# Patient Record
Sex: Female | Born: 1965 | Race: White | Hispanic: No | Marital: Married | Smoking: Former smoker
Health system: Southern US, Community
[De-identification: ages and names within clinical notes are randomized; demographics above are authoritative.]

## PROBLEM LIST (undated history)

## (undated) DIAGNOSIS — F419 Anxiety disorder, unspecified: Secondary | ICD-10-CM

## (undated) DIAGNOSIS — M26609 Unspecified temporomandibular joint disorder, unspecified side: Secondary | ICD-10-CM

## (undated) DIAGNOSIS — T7840XA Allergy, unspecified, initial encounter: Secondary | ICD-10-CM

## (undated) DIAGNOSIS — I10 Essential (primary) hypertension: Secondary | ICD-10-CM

## (undated) DIAGNOSIS — R011 Cardiac murmur, unspecified: Secondary | ICD-10-CM

## (undated) DIAGNOSIS — N201 Calculus of ureter: Secondary | ICD-10-CM

## (undated) DIAGNOSIS — Z87442 Personal history of urinary calculi: Secondary | ICD-10-CM

## (undated) DIAGNOSIS — E785 Hyperlipidemia, unspecified: Secondary | ICD-10-CM

## (undated) DIAGNOSIS — Z8619 Personal history of other infectious and parasitic diseases: Secondary | ICD-10-CM

## (undated) HISTORY — PX: OTHER SURGICAL HISTORY: SHX169

## (undated) HISTORY — DX: Allergy, unspecified, initial encounter: T78.40XA

## (undated) HISTORY — PX: POLYPECTOMY: SHX149

## (undated) HISTORY — DX: Anxiety disorder, unspecified: F41.9

## (undated) HISTORY — DX: Essential (primary) hypertension: I10

## (undated) HISTORY — DX: Cardiac murmur, unspecified: R01.1

---
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
## 1998-01-20 ENCOUNTER — Inpatient Hospital Stay (HOSPITAL_COMMUNITY): Admission: AD | Admit: 1998-01-20 | Discharge: 1998-01-25 | Payer: Self-pay | Admitting: Obstetrics and Gynecology

## 1998-02-06 ENCOUNTER — Encounter (HOSPITAL_COMMUNITY): Admission: RE | Admit: 1998-02-06 | Discharge: 1998-05-07 | Payer: Self-pay | Admitting: Obstetrics and Gynecology

## 1999-04-12 ENCOUNTER — Other Ambulatory Visit: Admission: RE | Admit: 1999-04-12 | Discharge: 1999-04-12 | Payer: Self-pay | Admitting: Obstetrics and Gynecology

## 2000-04-27 ENCOUNTER — Encounter (INDEPENDENT_AMBULATORY_CARE_PROVIDER_SITE_OTHER): Payer: Self-pay | Admitting: Specialist

## 2000-04-27 ENCOUNTER — Encounter: Payer: Self-pay | Admitting: Emergency Medicine

## 2000-04-27 ENCOUNTER — Observation Stay (HOSPITAL_COMMUNITY): Admission: AD | Admit: 2000-04-27 | Discharge: 2000-04-28 | Payer: Self-pay | Admitting: Obstetrics & Gynecology

## 2000-04-27 HISTORY — PX: OTHER SURGICAL HISTORY: SHX169

## 2000-06-02 ENCOUNTER — Other Ambulatory Visit: Admission: RE | Admit: 2000-06-02 | Discharge: 2000-06-02 | Payer: Self-pay | Admitting: Obstetrics and Gynecology

## 2001-02-17 ENCOUNTER — Other Ambulatory Visit: Admission: RE | Admit: 2001-02-17 | Discharge: 2001-02-17 | Payer: Self-pay | Admitting: Obstetrics and Gynecology

## 2001-09-09 ENCOUNTER — Inpatient Hospital Stay (HOSPITAL_COMMUNITY): Admission: AD | Admit: 2001-09-09 | Discharge: 2001-09-13 | Payer: Self-pay | Admitting: Obstetrics and Gynecology

## 2001-10-08 ENCOUNTER — Other Ambulatory Visit: Admission: RE | Admit: 2001-10-08 | Discharge: 2001-10-08 | Payer: Self-pay | Admitting: Obstetrics and Gynecology

## 2002-10-25 ENCOUNTER — Encounter: Admission: RE | Admit: 2002-10-25 | Discharge: 2002-10-25 | Payer: Self-pay | Admitting: Family Medicine

## 2002-10-25 ENCOUNTER — Encounter: Payer: Self-pay | Admitting: Family Medicine

## 2002-11-11 ENCOUNTER — Other Ambulatory Visit: Admission: RE | Admit: 2002-11-11 | Discharge: 2002-11-11 | Payer: Self-pay | Admitting: Obstetrics and Gynecology

## 2004-02-14 ENCOUNTER — Other Ambulatory Visit: Admission: RE | Admit: 2004-02-14 | Discharge: 2004-02-14 | Payer: Self-pay | Admitting: Obstetrics and Gynecology

## 2004-09-13 ENCOUNTER — Emergency Department (HOSPITAL_COMMUNITY): Admission: EM | Admit: 2004-09-13 | Discharge: 2004-09-14 | Payer: Self-pay | Admitting: Emergency Medicine

## 2005-04-10 ENCOUNTER — Other Ambulatory Visit: Admission: RE | Admit: 2005-04-10 | Discharge: 2005-04-10 | Payer: Self-pay | Admitting: Obstetrics and Gynecology

## 2005-06-25 ENCOUNTER — Encounter: Admission: RE | Admit: 2005-06-25 | Discharge: 2005-06-25 | Payer: Self-pay | Admitting: Obstetrics and Gynecology

## 2006-04-30 ENCOUNTER — Emergency Department (HOSPITAL_COMMUNITY): Admission: EM | Admit: 2006-04-30 | Discharge: 2006-04-30 | Payer: Self-pay | Admitting: Pediatrics

## 2006-05-02 ENCOUNTER — Ambulatory Visit (HOSPITAL_BASED_OUTPATIENT_CLINIC_OR_DEPARTMENT_OTHER): Admission: RE | Admit: 2006-05-02 | Discharge: 2006-05-02 | Payer: Self-pay | Admitting: Urology

## 2007-09-21 ENCOUNTER — Ambulatory Visit (HOSPITAL_COMMUNITY): Admission: RE | Admit: 2007-09-21 | Discharge: 2007-09-21 | Payer: Self-pay | Admitting: Urology

## 2009-03-14 ENCOUNTER — Encounter: Admission: RE | Admit: 2009-03-14 | Discharge: 2009-03-14 | Payer: Self-pay | Admitting: Family Medicine

## 2009-04-08 ENCOUNTER — Emergency Department (HOSPITAL_COMMUNITY): Admission: EM | Admit: 2009-04-08 | Discharge: 2009-04-08 | Payer: Self-pay | Admitting: Emergency Medicine

## 2010-06-17 ENCOUNTER — Encounter: Payer: Self-pay | Admitting: Obstetrics and Gynecology

## 2010-08-29 LAB — URINE MICROSCOPIC-ADD ON

## 2010-08-29 LAB — POCT I-STAT, CHEM 8
Chloride: 106 mEq/L (ref 96–112)
Creatinine, Ser: 0.6 mg/dL (ref 0.4–1.2)
HCT: 38 % (ref 36.0–46.0)
Hemoglobin: 12.9 g/dL (ref 12.0–15.0)
Potassium: 3.3 mEq/L — ABNORMAL LOW (ref 3.5–5.1)
TCO2: 21 mmol/L (ref 0–100)

## 2010-08-29 LAB — URINALYSIS, ROUTINE W REFLEX MICROSCOPIC
Bilirubin Urine: NEGATIVE
Ketones, ur: NEGATIVE mg/dL
Nitrite: NEGATIVE
Protein, ur: NEGATIVE mg/dL
Specific Gravity, Urine: 1.021 (ref 1.005–1.030)
Urobilinogen, UA: 0.2 mg/dL (ref 0.0–1.0)

## 2010-10-09 NOTE — Op Note (Signed)
NAMESHEBA, Tanya Lawson             ACCOUNT NO.:  [id number]   MEDICAL RECORD NO.:  [id number]          PATIENT TYPE:  AMB   LOCATION:  DAY                          FACILITY:  [facility]   PHYSICIAN:  Lindaann Slough, M.D.  DATE OF BIRTH:  [birth year]   DATE OF PROCEDURE:  09/21/2007  DATE OF DISCHARGE:                               OPERATIVE REPORT   PREOPERATIVE DIAGNOSIS:  Right ureteral stone with hydronephrosis.   POSTOPERATIVE DIAGNOSIS:  Right ureteral stone with hydronephrosis.   PROCEDURE DONE:  Cystoscopy, right retrograde pyelogram, ureteroscopy,  stone extraction and insertion of double-J catheter.   SURGEON:  Dr. Wendie Simmer Nesi.   ANESTHESIA:  General.   INDICATIONS:  The patient is a 45 year old female who has a long history  of kidney stone.  She was seen in the office this morning with a history  of sudden onset of severe right flank pain associated with nausea and  vomiting.  A CT scan showed a 4-mm stone in the right distal ureter with  hydronephrosis.  She also has bilateral renal calculi.  She was  scheduled for ESL, however, the stone could not be seen and, therefore,  she is now scheduled for cystoscopy, right retrograde pyelogram,  ureteroscopy and stone extraction.   DESCRIPTION OF PROCEDURE:  Under general anesthesia, the patient was  prepped and draped and placed in the dorsolithotomy position.  A well  lubricated panendoscope was inserted in the bladder.  The bladder mucosa  was normal.  There was no stone or tumor in the bladder.  The ureteral  orifices were in normal position and shape.   Retrograde pyelogram:   A cone-tipped catheter was passed through the cystoscope and into the  right ureteral orifice.  There was a filling defect in the distal ureter  with a proximal hydroureter.  The dye could not fill the proximal ureter  nor the renal pelvis.  The cone-tipped catheter was then removed.   A Glidewire was passed through an open-ended catheter and the  open-ended  catheter and Glidewire were passed through the cystoscope into the right  ureteral orifice, and the guidewire was advanced up into the renal  pelvis.  The open-ended catheter was then advanced over the Glidewire  into the distal ureter and the Glidewire was removed.  It was replaced  with a sensor tip guidewire.  The open-ended catheter was then removed,  the bladder was then emptied and the cystoscope removed.  A semi-rigid  ureteroscope was then passed in the bladder and without difficulty in  the ureter.  The stone was then visualized in the distal ureter.  The  nitinol basket was then passed through the cystoscope and the stone was  grasped within the wires of the basket and extracted without difficulty.  The stone was then retrieved.  The ureteroscope was then reinserted in  the ureter and contrast was injected through the ureteroscope.  There  was no evidence of extravasation of contrast.  The ureteroscope was then  removed.  The sensor guide wire was then back-loaded into the cystoscope  and a #6-French - 26 double-J catheter was  passed over the guidewire.  The proximal curl of the double-J catheter is in the renal pelvis, the  distal curl is in the bladder.  The bladder was then emptied and the  cystoscope and guidewire removed.   The patient tolerated the procedure well and left the OR in satisfactory  condition to the post anesthesia care unit.      Lindaann Slough, M.D.  Electronically Signed     MN/MEDQ  D:  09/21/2007  T:  09/21/2007  Job:  [id number]

## 2010-10-12 NOTE — H&P (Signed)
[facility]  Patient:    Tanya Lawson, Tanya Lawson Visit Number: [id number] MRN: [id number]          Service Type: OBS Location: 910A 9140 01 Attending Physician:  Frederich Balding Dictated by:   Juluis Mire, M.D. Admit Date:  09/09/2001                           History and Physical  HISTORY OF PRESENT ILLNESS:   The patient is a 45 year old gravida 5, para 1, abortus 3, married white female who presents for repeat cesarean section. Last menstrual period was July 13th, giving her an estimated date of confinement of April 20th of this year; this gives her an estimated gestational age of 39+ weeks.  This is consistent with initial exam and early ultrasound.  In relation to the present admission, the patient had a previous cesarean section for failure to progress in August of 1999; that infant did weigh 9 pounds 14 ounces.  Estimated fetal weight of this baby is 8 to 9 pounds. She has had basically unfavorable cervical change and now presents for a repeat cesarean section.  The options of trial of labor have been discussed and declined by the patient.  Her prenatal course was complicated by advanced maternal age.  She did decline any type of genetic evaluation.  She states she would not terminate the pregnancy.  Ultrasound evaluations have basically been unremarkable.  ALLERGIES:                    She is allergic to PENICILLIN and SULFA.  MEDICATIONS:                  Prenatal vitamins.  PRENATAL LABORATORY DATA:     She is O-positive with a negative antibody screen.  She has a nonreactive serology, positive rubella titer, negative hepatitis B surface antigen.  Her maternal serum screening at 16 weeks was negative.  Her 50 g glucola was within normal limits.  PAST MEDICAL HISTORY:         Please see prenatal records.  FAMILY HISTORY:               Please see prenatal records.  SOCIAL HISTORY:               Please see prenatal  records.  REVIEW OF SYSTEMS:            Noncontributory.  PHYSICAL EXAMINATION:  VITAL SIGNS:                  The patient is afebrile with stable vital signs.  HEENT:                        The patient is normocephalic.  Pupils equal, round and reactive to light and accommodation.  Extraocular movements are intact.  Sclerae and conjunctivae clear.  Oropharynx clear.  NECK:                         Without thyromegaly.  BREASTS:                      No discrete masses.  LUNGS:                        Clear.  CARDIAC:  Regular rhythm and rate with a grade 2/6 systolic ejection murmur.  No clicks or gallops.  ABDOMEN:                      Gravid uterus consistent with dates.  There is no mass, organomegaly or tenderness.  Fetal heart tones are noted.  PELVIC:                       Cervix is long and closed.  EXTREMITIES:                  Trace edema.  NEUROLOGIC:                   Grossly within normal limits.  BASIC IMPRESSION:             Intrauterine pregnancy at term with prior cesarean section and desirous of repeat.  PLAN:                         The patient will undergo repeat cesarean section.  The risks of surgery have been discussed including the risk of infection, the risk of hemorrhage that could necessitate transfusion with the risks of AIDS or hepatitis, risk of injury to adjacent organs including bladder, bowel or ureters that could require further exploratory surgery, the risks of deep venous thrombosis and pulmonary embolus.  The patient expressed understanding of indications and risks. Dictated by:   Juluis Mire, M.D. Attending Physician:  Frederich Balding DD:  09/09/01 TD:  09/09/01 Job: [id number]

## 2010-10-12 NOTE — Op Note (Signed)
[facility]  Patient:    Tanya Lawson, Tanya Lawson                    MRN: [id number] Proc. Date: 04/27/00 Adm. Date:  [id number] Attending:  Minette Headland                           Operative Report  PREOPERATIVE DIAGNOSIS:       Suspected ectopic pregnancy.  POSTOPERATIVE DIAGNOSIS:      Left tubal pregnancy.  OPERATION:                    Laparoscopy, left salpingectomy with resection of tube and ectopic pregnancy.  SURGEON:                      Freddy Finner, M.D.  ANESTHESIA:                   General endotracheal.  INTRAOPERATIVE COMPLICATIONS: None  HISTORY:                      The patient is a 45 year old gravida 4, para 1, who had one spontaneous ab, and just had one previous ectopic pregnancy treated with methotrexate.  She was then followed in the office for approximately two weeks with findings thought initially to be consistent with early spontaneous ab.  On the evening prior to the surgery, she had intensified abdominal pain and went to the [facility] where she was evaluated and had a quantitative HCG of 15,000, and empty uterus, and complex adnexal mass.  She was transferred to [facility] and is now brought to the operating room for surgery.  INTRAOPERATIVE FINDINGS:      Dilated, bleeding, left fallopian tube with dilatation along 2/3 of the length of the tube.  The right fallopian tube and ovary were completely normal other than adhesions binding the fimbria to the left ovary.  The left ovary was normal.  The uterus itself was boggy and consistent with pregnancy.  There was a small amount of free blood in the abdomen on entry.  DESCRIPTION OF PROCEDURE:     Following admission, the patient was taken to the operating room and there placed on adequate general endotracheal anesthesia, placed in the dorsal lithotomy position using the Allen stirrup system.  Betadine prep of abdomen, perineum, and vagina was carried  out in the usual fashion.  Bladder was catheterized using sterile technique and a Foley catheter.  Hulka tenaculum was attached to the cervix; sterile drapes were applied to the abdomen.  Two small incisions were made, one at the umbilicus and one just above the symphysis.  Through the upper incision, a 10 mm trocar was introduced while elevating the anterior abdominal wall manually.  Direct inspection revealed adequate placement with no evidence of injury on entry, and there was peritoneal staining with blood on entry.  Pneumoperitoneum was allowed to accumulate with carbon dioxide gas.  A second 5 mm trocar was placed through an incision made just above the symphysis.  A blunt probe Nezhat irrigating system and grasping forceps were used through this trocar site.  Careful inspection of the pelvic contents was carried out, including inspection of both adnexa.  There was marked dilatation of the left fallopian tube which was dark and filled with blood clot and blood oozing from the distal fimbria of the left tube.  It was elected to perform a salpingectomy. The mesosalpinx was progressively developed and cauterized with bipolar forceps and then sharply divided to completely remove the left fallopian tube.  It was then removed through the umbilical port using a grasping forceps through the eye of the operating channel of the laparoscope.  Copious irrigation was carried out.  Repeat bipolar fulguration was carried out in the left adnexum to confirm complete hemostasis.  All irrigating solution was aspirated from the abdomen.  The procedure was terminated at this point.  Gas was allowed to escape from the abdomen.  The skin incisions were closed with interrupted subcuticular sutures of 3-0 Dexon.  Steri-Strips were applied to both incisions.  Then, 10 cc of 0.25% plain Marcaine was then used to inject into the incision sites for postoperative analgesia.  The patient tolerated the operative  procedure well and was taken to the recovery room in good condition to be admitted overnight as an observation status patient. DD:  04/27/00 TD:  04/27/00 Job: [id number]

## 2010-10-12 NOTE — Discharge Summary (Signed)
[facility]  Patient:    Tanya Lawson, Tanya Lawson Visit Number: [id number] MRN: [id number]          Service Type: OBS Location: 910A 9140 01 Attending Physician:  Frederich Balding Dictated by:   Julio Sicks, N.P. Admit Date:  09/09/2001 Discharge Date: 09/13/2001                             Discharge Summary  ADMITTING DIAGNOSIS:             Intrauterine pregnancy at term with prior                                  cesarean delivery and desirous of repeat.  DISCHARGE DIAGNOSIS:             Low transverse cesarean delivery of a viable                                  female infant.  PROCEDURE:                       Repeat low transverse cesarean section.  REASON FOR ADMISSION:            Please see dictated H&P.  HOSPITAL COURSE:                 The patient was taken to the operating room for the above-named procedure, a Pfannenstiel incision was made with a viable female infant delivered weighing 8 pounds 5 ounces with Apgars of 9 at one minute and 9 at five minutes.  Umbilical artery pH was 7.33.  On postoperative day #1, the patient had good return of bowel function.  Fundus was firm, lochia small.  Abdominal dressing was clean, dry, and intact.  Foley was removed and the patient was voiding without difficulty.  On postoperative day #2, abdomen remained soft, incision was clean, dry, and intact.  Labs revealed a hemoglobin of 8.3, hematocrit of 23.7, and WBC count 10.8.  On postoperative day #3, the patient was doing well, ambulating without assistance.  On postoperative day #4, the patient was doing well, staples were removed and the patient was discharged home.  CONDITION ON DISCHARGE:          Good.  DIET:                            Regular as tolerated.  ACTIVITY:                        No heavy lifting, no driving x2 weeks, and no vaginal entry.  FOLLOWUP:                        The patient is to follow up in the office in 1-2 weeks  for an incision check.  She is to call for a temperature greater than 100 degrees, persistent nausea and vomiting, heavy vaginal bleeding and/or redness or drainage from the incision site.  DISCHARGE MEDICATIONS:           1. Tylox one to two every 3-4 hours p.r.n.  pain.                                  2. Motrin 600 mg over the counter every                                     6 hours.                                  3. Prenatal vitamins one p.o. daily. Dictated by:   Julio Sicks, N.P. Attending Physician:  Frederich Balding DD:  10/01/01 TD:  10/05/01 Job: [id number]

## 2010-10-12 NOTE — Op Note (Signed)
Tanya Lawson, Tanya Lawson             ACCOUNT NO.:  [id number]   MEDICAL RECORD NO.:  [id number]          PATIENT TYPE:  AMB   LOCATION:  NESC                         FACILITY:  [facility]   PHYSICIAN:  Boston Service, M.D.DATE OF BIRTH:  [birth year]   DATE OF PROCEDURE:  05/02/2006  DATE OF DISCHARGE:                               OPERATIVE REPORT   INTERNAL MEDICINE:  Talmadge Coventry, M.D.   GYNECOLOGY:  Juluis Mire, M.D.   UROLOGY:  Boston Service, M.D.   PREOP DIAGNOSIS:  A 6-mm right ureteral calculus.  Stone was in the  lower pole of the right kidney on CAT scan 09/13/2004.  The stone moved  to the right UPJ on  a KUB on 04/30/2006.  Retrograde films, however, on  05/02/2006 showed that the stone had moved to the distal ureter; it  measured approximately 6 mm.   POSTOP DIAGNOSIS:  A 6-mm right ureteral calculus.  Stone was in the  lower pole of the right kidney on CAT scan 09/13/2004.  The stone moved  to the right UPJ on  a KUB on 04/30/2006.  Retrograde films, however, on  05/02/2006 showed that the stone had moved to the distal ureter; it  measured approximately 6 mm.   PROCEDURES:  1. Cystoscopy.  2. Retrograde ureteroscopy.  3. Holmium laser fragmentation of distal ureteral calculus, double-J      stent placement.   ANESTHESIA:  General.   DRAINS:  A 6-French, 24 cm double-J stent, 18-French Foley.   COMPLICATIONS:  None obvious.   SURGEON:  Boston Service, M.D.   ASSISTANT:  None.   SPECIMENS:  Tiny stone fragments.   DESCRIPTION OF PROCEDURE:  The patient was prepped and draped in the  dorsal lithotomy position after institution of an adequate level of  general anesthesia.  A well-lubricated 21-French panendoscope was gently  inserted at the urethral meatus, normal urethra and sphincter, clear  efflux of left orifice, minimal efflux right orifice.  Blocking catheter  was selected; retrograde films were obtained.  Normal course and caliber  of  the ureter, pelvis, and calyces on the left side with prompt drainage  at 3-5 minutes.  Right retrogrades showed stone which 2 days prior had  been at the right UPJ was now at the right UVJ; and measured as about a  6-mm filling defect in the distal ureter.  With gentle injection of  contrast, I was able to negotiate the end-hole catheter beyond the  calculus.   There was an immediate hydronephrotic drip of purulent urine from the  right kidney.  This was allowed to subside over a period of about 50-20  minutes.  The ureteroscope was inserted alongside the guidewire; stone  was localized in the distal ureter; and felt to be too big to extract  with a basket.  A 365 holmium fiber was selected at a setting of 0.5.  Stone was then slowly fragmented over period of about 20 or 25 minutes.  A multitude of small 1-mm fragments were created.  No attempt was made  to remove any of the fragments from the distal ureter.  Ureteroscope  was  withdrawn.   A 6-French 24 cm double-J stent was then passed over the indwelling  guidewire with excellent pigtail formation on guidewire removal.  Foley  catheter was placed after cystoscope had been withdrawn.  The patient  was returned to recovery in satisfactory condition.           ______________________________  Boston Service, M.D.     RH/MEDQ  D:  05/02/2006  T:  05/02/2006  Job:  [id number]   cc:   Talmadge Coventry, M.D.  Fax: [phone]   Juluis Mire, M.D.  Fax: [phone]

## 2010-10-12 NOTE — Op Note (Signed)
[facility]  Patient:    Tanya Lawson, Tanya Lawson Visit Number: [id number] MRN: [id number]          Service Type: OBS Location: 910B 9198 02 Attending Physician:  Frederich Balding Dictated by:   Juluis Mire, M.D. Proc. Date: 09/09/01 Admit Date:  09/09/2001                             Operative Report  PREOPERATIVE DIAGNOSES:       1. Intrauterine pregnancy at term.                               2. Prior cesarean section, desires repeat.  POSTOPERATIVE DIAGNOSES:      1. Intrauterine pregnancy at term.                               2. Prior cesarean section, desires repeat.  OPERATIVE PROCEDURE:          Repeat low transverse cesarean section.  SURGEON:                      Juluis Mire, M.D.  ANESTHESIA:                   Spinal.  ESTIMATED BLOOD LOSS:         800 cc.  PACKS AND DRAINS:             None.  INTRAOPERATIVE BLOOD REPLACED:               None.  COMPLICATIONS:                None.  INDICATIONS:                  Noted in the history and physical.  DESCRIPTION OF PROCEDURE:     The patient was taken to the OR and placed in the supine position with a left lateral tilt.  After a satisfactory level of spinal anesthesia was obtained, the abdomen was prepped out with Betadine and draped as a sterile field.  A prior flow transverse skin incision was excised. The incision was then extended through the subcutaneous tissue.  The fascia was entered sharply and the incision in the fascia extended laterally.  The fascia was then taken off of the muscles superiorly and inferiorly.  The rectus muscles were separated in the midline.  The peritoneum was entered sharply and the incision in the peritoneum extended both superiorly and inferiorly.  A low transverse bladder flap was developed.  A low transverse uterine incision was begun with a knife and extended laterally using manual traction.  The infant was delivered from the Vertex  presentation.  It was delivered with elevation of the head and fundal pressure.  The infant was a viable female who weighed 8 pounds 5 ounces.  Apgars were 9/9.  Umbilical artery pH was 7.33.  There was a slight superficial scalpel nick on the babys right forehead.  It was extremely superficial.  The pediatricians were attending that.  The placenta was then delivered manually.  The uterus was wiped free of remaining membranes and placenta.  The uterus was closed with running locking suture of 0 Vicryl using a two-layer closure technique.  Areas of continued bleeding were brought under control with  figure-of-eights of 0 Vicryl.  At this point in time, we had good hemostasis.  The tubes and ovaries were unremarkable.  Urine output remained clear and adequate.  At this point in time, the muscles were approximated with running suture of 3-0 Vicryl.  The fascia was closed with a running suture of 0 PDS.  The skin was closed with staples and Steri-Strips.  Sponge, needle and instrument counts were reported as correct by the circulating nurse x2.  The Foley catheter remained clear at the time of closure.  The patient tolerated the procedure well and was returned to the recovery room in good condition. Dictated by:   Juluis Mire, M.D. Attending Physician:  Frederich Balding DD:  09/09/01 TD:  09/09/01 Job: [id number]

## 2011-02-19 LAB — HEMOGLOBIN AND HEMATOCRIT, BLOOD
HCT: 40.4
Hemoglobin: 13.1

## 2011-02-25 ENCOUNTER — Encounter (INDEPENDENT_AMBULATORY_CARE_PROVIDER_SITE_OTHER): Payer: Self-pay | Admitting: Surgery

## 2011-02-26 ENCOUNTER — Encounter (INDEPENDENT_AMBULATORY_CARE_PROVIDER_SITE_OTHER): Payer: Self-pay | Admitting: General Surgery

## 2011-02-27 ENCOUNTER — Encounter (INDEPENDENT_AMBULATORY_CARE_PROVIDER_SITE_OTHER): Payer: Self-pay | Admitting: General Surgery

## 2011-02-27 ENCOUNTER — Ambulatory Visit (INDEPENDENT_AMBULATORY_CARE_PROVIDER_SITE_OTHER): Payer: Commercial Indemnity | Admitting: General Surgery

## 2011-02-27 VITALS — BP 118/78 | HR 60 | Temp 97.5°F | Resp 20 | Ht 63.0 in | Wt 204.0 lb

## 2011-02-27 DIAGNOSIS — L089 Local infection of the skin and subcutaneous tissue, unspecified: Secondary | ICD-10-CM

## 2011-02-27 DIAGNOSIS — B029 Zoster without complications: Secondary | ICD-10-CM

## 2011-02-27 NOTE — Patient Instructions (Signed)
Keep the area clean and apply topical antibiotic cream, we will call with the path report when it returns

## 2011-02-27 NOTE — Progress Notes (Signed)
Subjective:     Patient ID: Tanya Lawson, female   DOB: Sep 29, 1965, 45 y.o.   MRN: [id number]  HPI The patient returns I saw her proximal 4-5 months ago when she says that she's had problems with a reoccurrence abscess "input that was originally informed of the perirectal infection with Dr. Luan Pulling many years ago I could find no evidence of any abscess on examination of the anus wondered if this could be a perirectal infection and did center before Y. 10 and white count was normal also got Dr. 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 and he could find no evidence of any frank abscess or problem within the anal canal either I suggested if she follow problems to return to see [place] in now she returns for months later she said that over the weekend she started having areas of tenderness and then her menstrual period started today and she got some low blisters in this general area that spontaneously drained yesterday appear on examination this is not a true perirectal bladder scan in the buttocks area left side done in the area where these problems have always, reoccurred looped up all of the gurgling and the only thing that we can see that was possibly related to the onset of her. This is certainly not endometriosis in this area as it could this be herpes zoster all of this type of problem and I got Dr. Dwain Sarna to reexamine her and he felt that although biopsy of the area of the scan would certainly be the next step actually did an ultrasound of fatty tissue and I found no evidence of any deep pocket like a true abscess but she's got cavity well in this area the scan that measures about 2 x 3 cm that is certainly swollen right laboratory process. The patient says that when she was 5 or 6 she had chickenpox that she's never had a thing that was actually diagnosed as showing the her pain is always in the same location this, a posterior buttocks right were used  to not truly infection around the anus and always comes of right before her period. This does not occur every menstrual cycle but she was just probably one out of three Review of Systems Past Medical History  Diagnosis Date  . Hypertension   . Heart murmur   . Rectal pain    Past Surgical History  Procedure Date  . Tonsillectomy 1985  . Cesarean section 1999, 2003  . Cervical dysplasia 1987  . Tubal ligation 2001    repair  . Ectopic rupture  December 5th, 2002  . Kidney stone removal  2008       Objective:   Physical ExamBP 118/78  Pulse 60  Temp 97.5 F (36.4 C)  Resp 20  Ht 5\' 3"  (1.6 m)  Wt 204 lb (92.534 kg)  BMI 36.14 kg/m2  Examination shows an area of erythema at 3 or 4 cm from the anal area on the left buttocks does wear pressure would bleed when she stated there is edema with not really a folliculitis within this little blisters that have spontaneously drained yesterday slightly more lateral I did a little triangle biopsied and the underlying tissue and dermis and I'll is definitely thickened and hopefully the pathologist will be able to also is consistent with shingles or herpes simplex. There is no frank abscess and we're going to just treat her with topical antibiotic cream.  Assessment:     See above note    Plan:    Followup 2 weeks

## 2011-03-08 ENCOUNTER — Telehealth (INDEPENDENT_AMBULATORY_CARE_PROVIDER_SITE_OTHER): Payer: Self-pay

## 2011-03-08 NOTE — Telephone Encounter (Signed)
Report faxed today- Patient will be called with results on Tuesday after Dr. Zachery Dakins reviews path.

## 2011-03-08 NOTE — Telephone Encounter (Signed)
Patient called requesting her biopsy results.  Please call

## 2011-03-12 ENCOUNTER — Ambulatory Visit (INDEPENDENT_AMBULATORY_CARE_PROVIDER_SITE_OTHER): Payer: Commercial Indemnity | Admitting: General Surgery

## 2011-03-14 ENCOUNTER — Encounter (INDEPENDENT_AMBULATORY_CARE_PROVIDER_SITE_OTHER): Payer: Self-pay | Admitting: General Surgery

## 2011-03-15 ENCOUNTER — Ambulatory Visit (INDEPENDENT_AMBULATORY_CARE_PROVIDER_SITE_OTHER): Payer: Commercial Indemnity | Admitting: General Surgery

## 2011-03-15 ENCOUNTER — Telehealth (INDEPENDENT_AMBULATORY_CARE_PROVIDER_SITE_OTHER): Payer: Self-pay

## 2011-03-15 NOTE — Telephone Encounter (Signed)
Patient given path report results. She will follow up during or if the problem should began to occur again here in the office. RMP

## 2011-03-31 IMAGING — CR DG CHEST 2V
2 series · 2 of 2 positions shown · non-contrast
Comparison: None

CLINICAL DATA: Cough.

CHEST - 2 VIEW

[w chest pa]
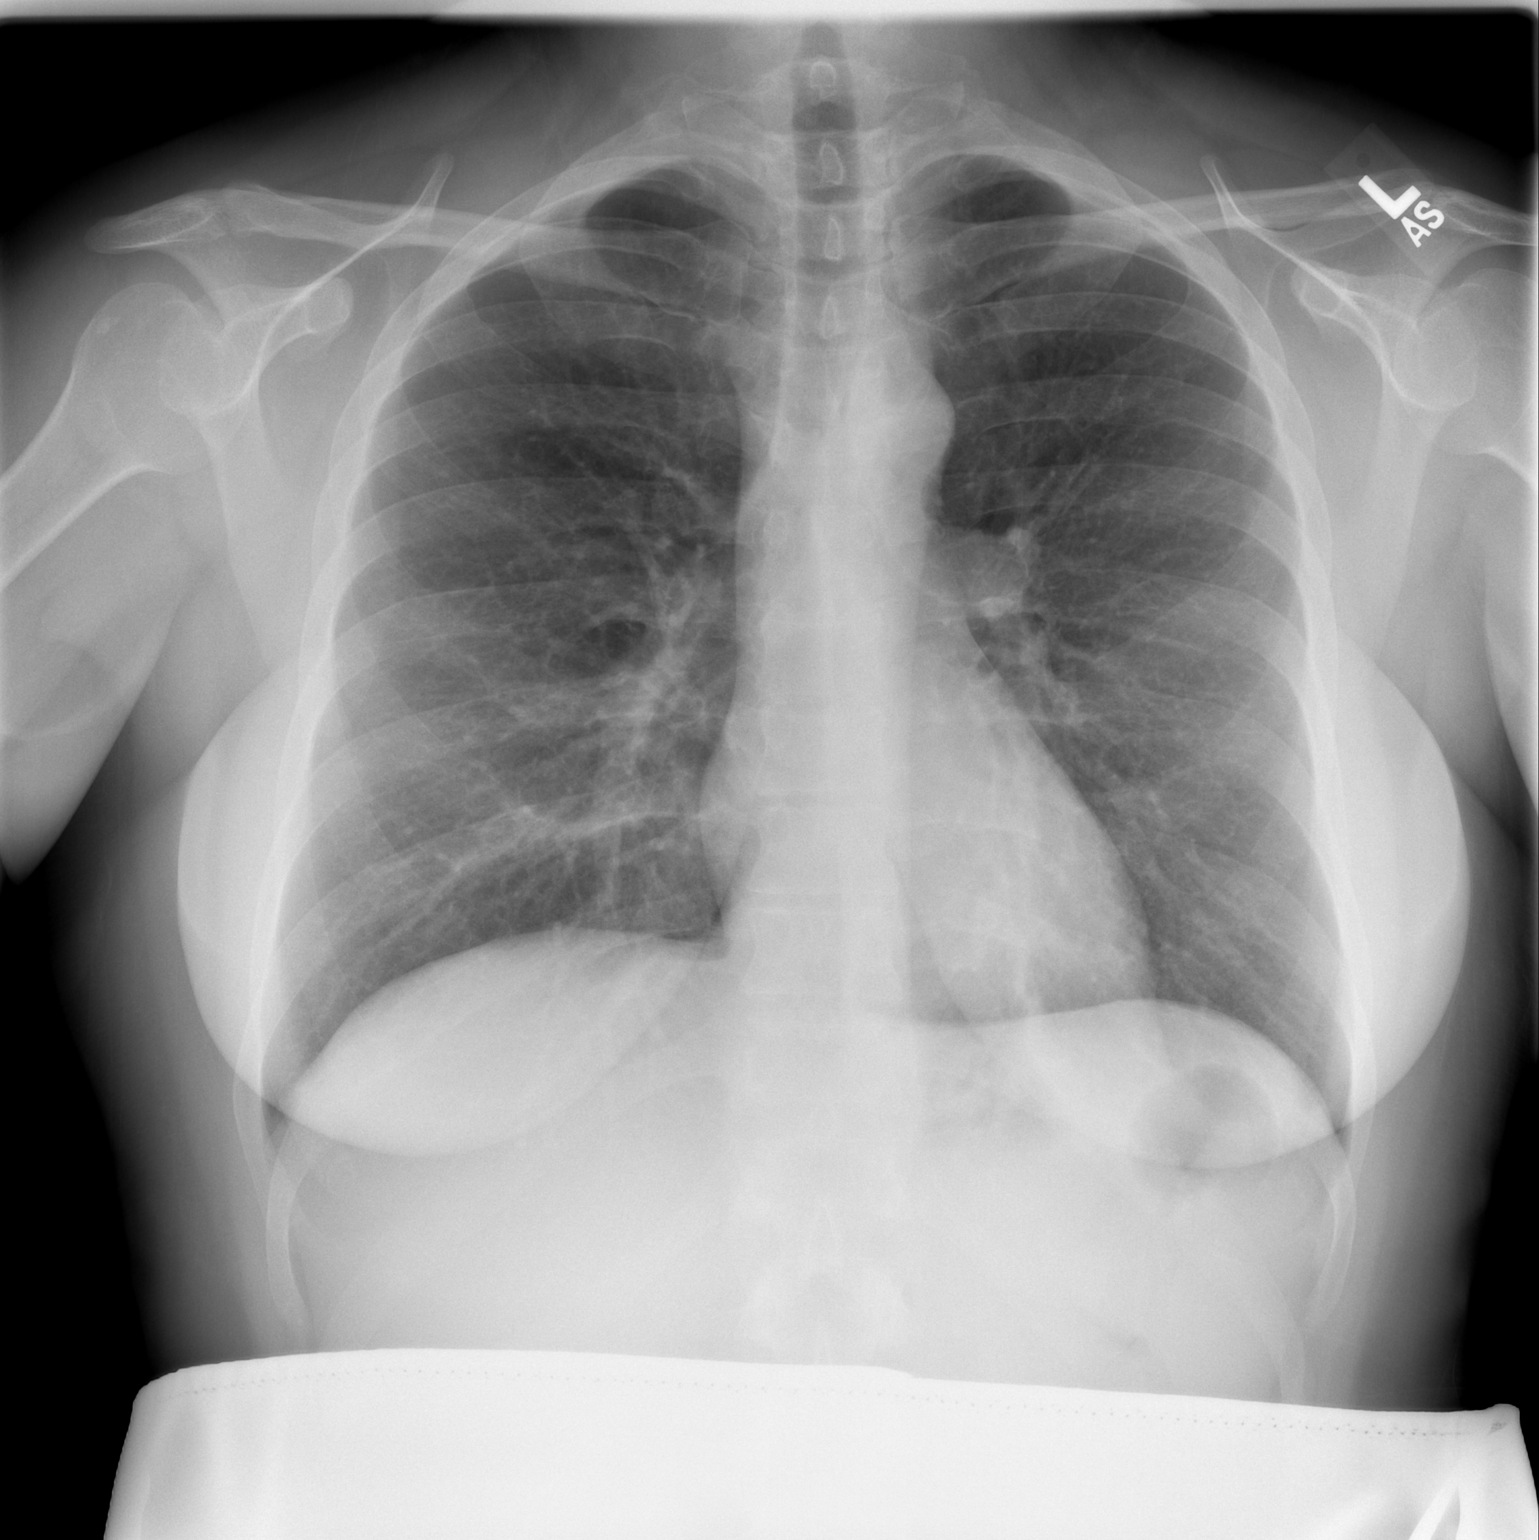

[w chest lat]
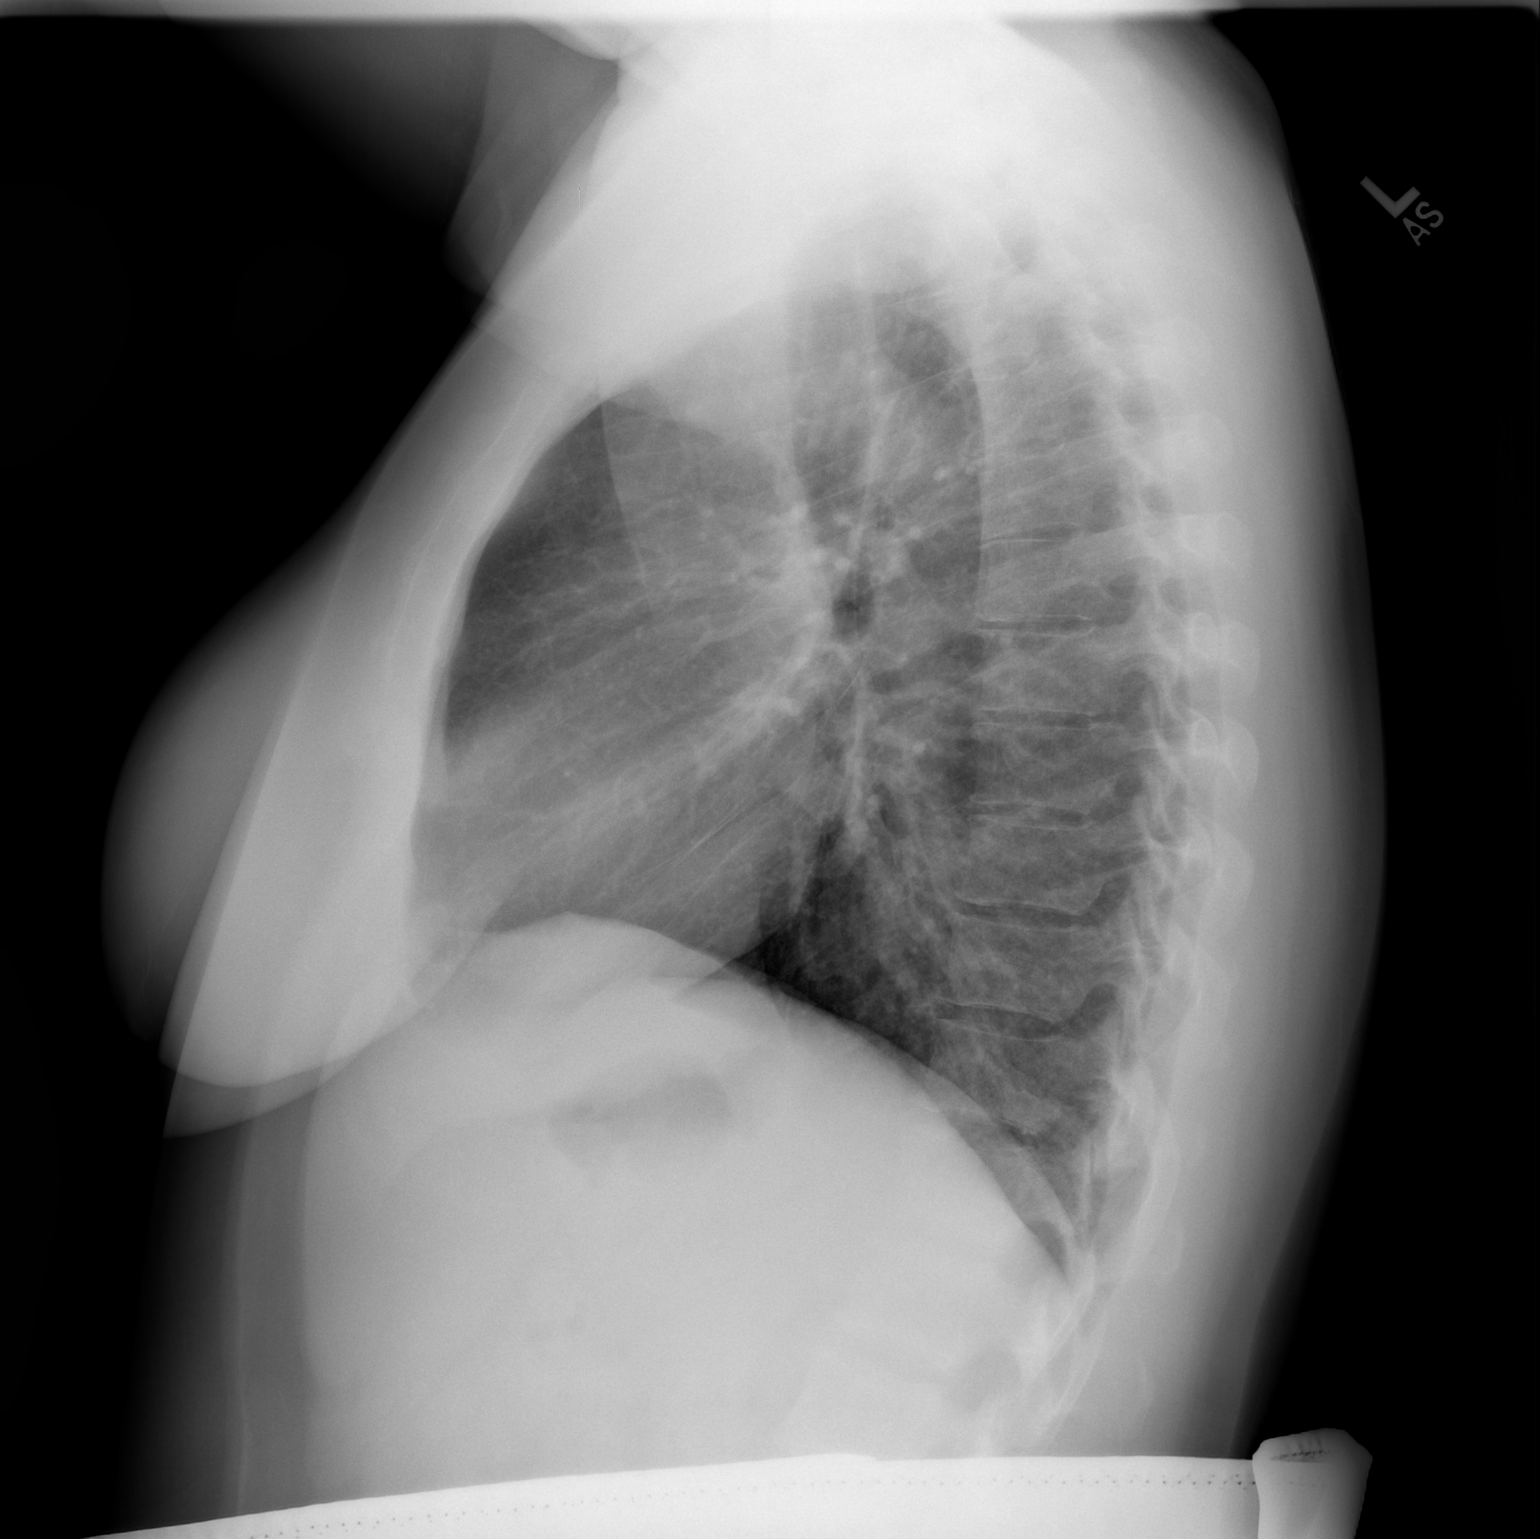

[2 of 2 positions shown; findings below may reference images not displayed]

FINDINGS: The cardiac silhouette, mediastinal and hilar contours
are within normal limits.  There are mild bronchitic changes which
may suggest bronchitis.  There is also streaky bibasilar
atelectasis.  No focal airspace consolidation or pleural effusion.
The bony thorax is intact.
IMPRESSION: Mild bronchitic changes and streaky basilar atelectasis suggesting
bronchitis.  No focal infiltrate.

## 2011-05-28 HISTORY — PX: OTHER SURGICAL HISTORY: SHX169

## 2011-09-02 ENCOUNTER — Encounter (INDEPENDENT_AMBULATORY_CARE_PROVIDER_SITE_OTHER): Payer: Commercial Indemnity | Admitting: Surgery

## 2013-06-21 ENCOUNTER — Encounter (HOSPITAL_COMMUNITY): Payer: Self-pay | Admitting: Emergency Medicine

## 2013-06-21 ENCOUNTER — Emergency Department (HOSPITAL_COMMUNITY)
Admission: EM | Admit: 2013-06-21 | Discharge: 2013-06-21 | Discharge status: Against Medical Advice | Payer: Managed Care, Other (non HMO) | Attending: Emergency Medicine | Admitting: Emergency Medicine

## 2013-06-21 DIAGNOSIS — I10 Essential (primary) hypertension: Secondary | ICD-10-CM | POA: Insufficient documentation

## 2013-06-21 DIAGNOSIS — R109 Unspecified abdominal pain: Secondary | ICD-10-CM | POA: Insufficient documentation

## 2013-06-21 DIAGNOSIS — R011 Cardiac murmur, unspecified: Secondary | ICD-10-CM | POA: Insufficient documentation

## 2013-06-21 NOTE — ED Notes (Signed)
Pt c/o abd pain since 10am; nausea; cramping

## 2013-06-21 NOTE — ED Notes (Signed)
Pt states abd pain is better and she is going to get checked at her doctor's office; left with husband

## 2013-11-10 ENCOUNTER — Other Ambulatory Visit: Payer: Self-pay | Admitting: Urology

## 2013-11-17 ENCOUNTER — Encounter (HOSPITAL_BASED_OUTPATIENT_CLINIC_OR_DEPARTMENT_OTHER): Payer: Self-pay | Admitting: *Deleted

## 2013-11-19 ENCOUNTER — Encounter (HOSPITAL_BASED_OUTPATIENT_CLINIC_OR_DEPARTMENT_OTHER): Payer: Self-pay | Admitting: *Deleted

## 2013-11-19 NOTE — Progress Notes (Addendum)
NPO AFTER MN. ARRIVE AT 0700.  NEEDS ISTAT, URINE PREG.,  EKG, AND KUB.  WILL TAKE METOPROLOL AM DOS W/ SIPS OF WATER.

## 2013-11-23 ENCOUNTER — Encounter (HOSPITAL_BASED_OUTPATIENT_CLINIC_OR_DEPARTMENT_OTHER): Payer: Managed Care, Other (non HMO) | Admitting: Anesthesiology

## 2013-11-23 ENCOUNTER — Ambulatory Visit (HOSPITAL_BASED_OUTPATIENT_CLINIC_OR_DEPARTMENT_OTHER): Payer: Managed Care, Other (non HMO) | Admitting: Anesthesiology

## 2013-11-23 ENCOUNTER — Encounter (HOSPITAL_BASED_OUTPATIENT_CLINIC_OR_DEPARTMENT_OTHER): Payer: Self-pay | Admitting: Anesthesiology

## 2013-11-23 ENCOUNTER — Ambulatory Visit (HOSPITAL_COMMUNITY): Payer: Managed Care, Other (non HMO)

## 2013-11-23 ENCOUNTER — Encounter (HOSPITAL_BASED_OUTPATIENT_CLINIC_OR_DEPARTMENT_OTHER)
Admission: RE | Disposition: A | Discharge status: Home / Self Care | Payer: Self-pay | Source: Ambulatory Visit | Attending: Urology

## 2013-11-23 ENCOUNTER — Ambulatory Visit (HOSPITAL_BASED_OUTPATIENT_CLINIC_OR_DEPARTMENT_OTHER)
Admission: RE | Admit: 2013-11-23 | Discharge: 2013-11-23 | Disposition: A | Discharge status: Home / Self Care | Payer: Managed Care, Other (non HMO) | Source: Ambulatory Visit | Attending: Urology | Admitting: Urology

## 2013-11-23 DIAGNOSIS — Z6838 Body mass index (BMI) 38.0-38.9, adult: Secondary | ICD-10-CM | POA: Insufficient documentation

## 2013-11-23 DIAGNOSIS — E7800 Pure hypercholesterolemia, unspecified: Secondary | ICD-10-CM | POA: Insufficient documentation

## 2013-11-23 DIAGNOSIS — Z882 Allergy status to sulfonamides status: Secondary | ICD-10-CM | POA: Insufficient documentation

## 2013-11-23 DIAGNOSIS — Z79899 Other long term (current) drug therapy: Secondary | ICD-10-CM | POA: Insufficient documentation

## 2013-11-23 DIAGNOSIS — N201 Calculus of ureter: Secondary | ICD-10-CM | POA: Insufficient documentation

## 2013-11-23 DIAGNOSIS — I10 Essential (primary) hypertension: Secondary | ICD-10-CM | POA: Insufficient documentation

## 2013-11-23 DIAGNOSIS — F411 Generalized anxiety disorder: Secondary | ICD-10-CM | POA: Insufficient documentation

## 2013-11-23 DIAGNOSIS — Z87891 Personal history of nicotine dependence: Secondary | ICD-10-CM | POA: Insufficient documentation

## 2013-11-23 HISTORY — PX: CYSTOSCOPY WITH RETROGRADE PYELOGRAM, URETEROSCOPY AND STENT PLACEMENT: SHX5789

## 2013-11-23 HISTORY — DX: Hyperlipidemia, unspecified: E78.5

## 2013-11-23 HISTORY — DX: Unspecified temporomandibular joint disorder, unspecified side: M26.609

## 2013-11-23 HISTORY — DX: Personal history of urinary calculi: Z87.442

## 2013-11-23 HISTORY — DX: Calculus of ureter: N20.1

## 2013-11-23 HISTORY — DX: Personal history of other infectious and parasitic diseases: Z86.19

## 2013-11-23 LAB — POCT I-STAT 4, (NA,K, GLUC, HGB,HCT)
Glucose, Bld: 94 mg/dL (ref 70–99)
HEMATOCRIT: 34 % — AB (ref 36.0–46.0)
Hemoglobin: 11.6 g/dL — ABNORMAL LOW (ref 12.0–15.0)
Potassium: 3.5 mEq/L — ABNORMAL LOW (ref 3.7–5.3)
Sodium: 137 mEq/L (ref 137–147)

## 2013-11-23 LAB — POCT PREGNANCY, URINE: Preg Test, Ur: NEGATIVE

## 2013-11-23 SURGERY — CYSTOURETEROSCOPY, WITH RETROGRADE PYELOGRAM AND STENT INSERTION
Anesthesia: General | Site: Ureter | Laterality: Right

## 2013-11-23 MED ORDER — STERILE WATER FOR IRRIGATION IR SOLN
Status: DC | PRN
Start: 2013-11-23 — End: 2013-11-23
  Administered 2013-11-23: 500 mL

## 2013-11-23 MED ORDER — KETOROLAC TROMETHAMINE 30 MG/ML IJ SOLN
INTRAMUSCULAR | Status: DC | PRN
  Administered 2013-11-23: 30 mg via INTRAVENOUS

## 2013-11-23 MED ORDER — LACTATED RINGERS IV SOLN
INTRAVENOUS | Status: DC
  Administered 2013-11-23: 11:00:00 via INTRAVENOUS
  Filled 2013-11-23: qty 1000

## 2013-11-23 MED ORDER — FENTANYL CITRATE 0.05 MG/ML IJ SOLN
INTRAMUSCULAR | Status: AC
  Filled 2013-11-23: qty 4

## 2013-11-23 MED ORDER — HYDROCODONE-ACETAMINOPHEN 5-325 MG PO TABS: 1.0000 | ORAL_TABLET | Freq: Four times a day (QID) | ORAL | Status: DC | PRN

## 2013-11-23 MED ORDER — ONDANSETRON HCL 4 MG/2ML IJ SOLN
INTRAMUSCULAR | Status: DC | PRN
  Administered 2013-11-23: 4 mg via INTRAVENOUS

## 2013-11-23 MED ORDER — LIDOCAINE HCL (CARDIAC) 20 MG/ML IV SOLN
INTRAVENOUS | Status: DC | PRN
  Administered 2013-11-23: 100 mg via INTRAVENOUS

## 2013-11-23 MED ORDER — FENTANYL CITRATE 0.05 MG/ML IJ SOLN
25.0000 ug | INTRAMUSCULAR | Status: DC | PRN
  Filled 2013-11-23: qty 1

## 2013-11-23 MED ORDER — PROPOFOL 10 MG/ML IV BOLUS
INTRAVENOUS | Status: DC | PRN
  Administered 2013-11-23: 280 mg via INTRAVENOUS
  Administered 2013-11-23: 20 mg via INTRAVENOUS

## 2013-11-23 MED ORDER — LACTATED RINGERS IV SOLN
INTRAVENOUS | Status: DC
  Administered 2013-11-23: 08:00:00 via INTRAVENOUS
  Filled 2013-11-23: qty 1000

## 2013-11-23 MED ORDER — IOHEXOL 350 MG/ML SOLN
INTRAVENOUS | Status: DC | PRN
  Administered 2013-11-23: 10 mL

## 2013-11-23 MED ORDER — CIPROFLOXACIN IN D5W 400 MG/200ML IV SOLN
400.0000 mg | INTRAVENOUS | Status: AC
  Administered 2013-11-23: 400 mg via INTRAVENOUS
  Filled 2013-11-23: qty 200

## 2013-11-23 MED ORDER — MIDAZOLAM HCL 5 MG/5ML IJ SOLN
INTRAMUSCULAR | Status: DC | PRN
  Administered 2013-11-23: 2 mg via INTRAVENOUS

## 2013-11-23 MED ORDER — SODIUM CHLORIDE 0.9 % IR SOLN
Status: DC | PRN
  Administered 2013-11-23: 4000 mL via INTRAVESICAL

## 2013-11-23 MED ORDER — DEXAMETHASONE SODIUM PHOSPHATE 4 MG/ML IJ SOLN
INTRAMUSCULAR | Status: DC | PRN
  Administered 2013-11-23: 10 mg via INTRAVENOUS

## 2013-11-23 MED ORDER — MIDAZOLAM HCL 2 MG/2ML IJ SOLN
INTRAMUSCULAR | Status: AC
  Filled 2013-11-23: qty 2

## 2013-11-23 MED ORDER — ACETAMINOPHEN 10 MG/ML IV SOLN
INTRAVENOUS | Status: DC | PRN
  Administered 2013-11-23: 1000 mg via INTRAVENOUS

## 2013-11-23 MED ORDER — FENTANYL CITRATE 0.05 MG/ML IJ SOLN
INTRAMUSCULAR | Status: DC | PRN
  Administered 2013-11-23 (×2): 50 ug via INTRAVENOUS

## 2013-11-23 SURGICAL SUPPLY — 40 items
ADAPTER CATH URET PLST 4-6FR (CATHETERS) IMPLANT
ADPR CATH URET STRL DISP 4-6FR (CATHETERS)
BAG DRAIN URO-CYSTO SKYTR STRL (DRAIN) ×4 IMPLANT
BAG DRN UROCATH (DRAIN) ×2
BASKET LASER NITINOL 1.9FR (BASKET) IMPLANT
BASKET STNLS GEMINI 4WIRE 3FR (BASKET) IMPLANT
BASKET ZERO TIP NITINOL 2.4FR (BASKET) ×3 IMPLANT
BSKT STON RTRVL 120 1.9FR (BASKET)
BSKT STON RTRVL GEM 120X11 3FR (BASKET)
BSKT STON RTRVL ZERO TP 2.4FR (BASKET) ×2
CANISTER SUCT LVC 12 LTR MEDI- (MISCELLANEOUS) IMPLANT
CATH INTERMIT  6FR 70CM (CATHETERS) IMPLANT
CATH URET 5FR 28IN CONE TIP (BALLOONS)
CATH URET 5FR 28IN OPEN ENDED (CATHETERS) IMPLANT
CATH URET 5FR 70CM CONE TIP (BALLOONS) IMPLANT
CLOTH BEACON ORANGE TIMEOUT ST (SAFETY) ×4 IMPLANT
CONTOUR STENT ×2 IMPLANT
DRAPE CAMERA CLOSED 9X96 (DRAPES) IMPLANT
FIBER LASER FLEXIVA 1000 (UROLOGICAL SUPPLIES) IMPLANT
FIBER LASER FLEXIVA 200 (UROLOGICAL SUPPLIES) IMPLANT
FIBER LASER FLEXIVA 365 (UROLOGICAL SUPPLIES) IMPLANT
FIBER LASER FLEXIVA 550 (UROLOGICAL SUPPLIES) IMPLANT
GLOVE BIO SURGEON STRL SZ 6.5 (GLOVE) ×2 IMPLANT
GLOVE BIO SURGEON STRL SZ7 (GLOVE) ×4 IMPLANT
GLOVE BIO SURGEONS STRL SZ 6.5 (GLOVE) ×1
GLOVE INDICATOR 7.0 STRL GRN (GLOVE) ×6 IMPLANT
GOWN STRL REUS W/ TWL LRG LVL3 (GOWN DISPOSABLE) ×2 IMPLANT
GOWN STRL REUS W/TWL LRG LVL3 (GOWN DISPOSABLE) ×8
GUIDEWIRE 0.038 PTFE COATED (WIRE) ×4 IMPLANT
GUIDEWIRE ANG ZIPWIRE 038X150 (WIRE) IMPLANT
GUIDEWIRE STR DUAL SENSOR (WIRE) IMPLANT
KIT BALLIN UROMAX 15FX10 (LABEL) IMPLANT
KIT BALLN UROMAX 15FX4 (MISCELLANEOUS) IMPLANT
KIT BALLN UROMAX 26 75X4 (MISCELLANEOUS)
NS IRRIG 500ML POUR BTL (IV SOLUTION) IMPLANT
PACK CYSTOSCOPY (CUSTOM PROCEDURE TRAY) ×4 IMPLANT
SET HIGH PRES BAL DIL (LABEL)
SHEATH URET ACCESS 12FR/35CM (UROLOGICAL SUPPLIES) IMPLANT
SHEATH URET ACCESS 12FR/55CM (UROLOGICAL SUPPLIES) IMPLANT
STENT URET 6FRX24 CONTOUR (STENTS) ×3 IMPLANT

## 2013-11-23 NOTE — Op Note (Signed)
[name] is a 48 y.o.   11/23/2013  General  Preop diagnosis: Right distal ureteral calculus  Postop diagnosis: Same  Procedure done: Cystoscopy, right retrograde pyelogram, ureteroscopy, stone extraction, insertion of double-J stent  Surgeon: Charlene Brooke. Nesi  Anesthesia: General  Indication: Patient is a 48 years old female who was initially seen in the emergency room at [facility] in January for right-sided lower abdominal pain. CT scan showed a 4 x 3 mm stone in the right distal ureter without hydronephrosis. Patient was then seen in the office and managed with  medical expulsive therapy. However she has not passed the stone. She has not had any pain. CT scan of the pelvis on 09/23/2013 showed a 3 mm stone in the right distal ureter. She has remained asymptomatic; however she has not passed the stone. KUB this morning showed the stone in the same location. She is scheduled now for cystoscopy and stone manipulation  Procedure: The patient was identified by her wrist band and proper timeout was taken.  Under general anesthesia she was prepped and draped and placed in the dorsolithotomy position. A panendoscope was inserted in the bladder. The bladder mucosa is normal. There is no stone or tumor in the bladder. The ureteral orifices are in normal position and shape.  Right retrograde pyelogram:  A cone-tip catheter was passed through the cystoscope and the right ureteral orifice. Contrast was injected through the cone-tip catheter. There is a filling defect in the distal ureter consistent with the known ureteral calculus. The cone-tip catheter was then removed.  A sensor wire was passed through the cystoscope and the right ureter. The cystoscope was removed. A semirigid ureteroscope was then passed in the bladder and through the right ureter. The stone was visualized in the distal ureter. The stone is relatively small and I think it can be removed without  fragmentation. A Nitinol basket was passed through the ureteroscope. The stone was caught within the wires of the basket and extracted without difficulty. The stone was retrieved. The ureteroscope was then inserted in the ureter. Contrast was then injected through the ureteroscope. There is no evidence of filling defect in the ureter and there is no extravasation of contrast. The proximal ureter and the renal pelvis are normal. The ureteroscope was then removed. The sensor wire was backloaded into the cystoscope and a #[id number] double-J stent was passed over the sensor wire. When the tip of the stent was in the renal pelvis the sensor wire was removed. The proximal curl of the double-J stent is in the renal pelvis. The distal curl is in the bladder. The stent was left without a string.  The patient tolerated the procedure well and left the OR in satisfactory condition to postanesthesia care unit.

## 2013-11-23 NOTE — Anesthesia Procedure Notes (Signed)
Procedure Name: LMA Insertion Date/Time: 11/23/2013 8:55 AM Performed by: Bethena Roys T Pre-anesthesia Checklist: Patient identified, Emergency Drugs available, Suction available and Patient being monitored Patient Re-evaluated:Patient Re-evaluated prior to inductionOxygen Delivery Method: Circle System Utilized Preoxygenation: Pre-oxygenation with 100% oxygen Intubation Type: IV induction Ventilation: Mask ventilation without difficulty LMA: LMA with gastric port inserted LMA Size: 5.0 Number of attempts: 1 Placement Confirmation: positive ETCO2 Tube secured with: Tape Dental Injury: Teeth and Oropharynx as per pre-operative assessment

## 2013-11-23 NOTE — Anesthesia Preprocedure Evaluation (Addendum)
Anesthesia Evaluation  Patient identified by MRN, date of birth, ID band Patient awake    Reviewed: Allergy & Precautions, H&P , NPO status , Patient's Chart, lab work & pertinent test results, reviewed documented beta blocker date and time   Airway Mallampati: II TM Distance: >3 FB Neck ROM: full    Dental no notable dental hx. (+) Teeth Intact, Dental Advisory Given   Pulmonary neg pulmonary ROS, former smoker,  breath sounds clear to auscultation  Pulmonary exam normal       Cardiovascular Exercise Tolerance: Good hypertension, Pt. on medications and Pt. on home beta blockers Rhythm:regular Rate:Normal     Neuro/Psych negative neurological ROS  negative psych ROS   GI/Hepatic negative GI ROS, Neg liver ROS,   Endo/Other  negative endocrine ROSMorbid obesity  Renal/GU negative Renal ROS  negative genitourinary   Musculoskeletal   Abdominal (+) + obese,   Peds  Hematology negative hematology ROS (+)   Anesthesia Other Findings TMJ syndrome  Reproductive/Obstetrics negative OB ROS                          Anesthesia Physical Anesthesia Plan  ASA: III  Anesthesia Plan: General   Post-op Pain Management:    Induction: Intravenous  Airway Management Planned: LMA  Additional Equipment:   Intra-op Plan:   Post-operative Plan:   Informed Consent: I have reviewed the patients History and Physical, chart, labs and discussed the procedure including the risks, benefits and alternatives for the proposed anesthesia with the patient or authorized representative who has indicated his/her understanding and acceptance.   Dental Advisory Given  Plan Discussed with: CRNA and Surgeon  Anesthesia Plan Comments:        Anesthesia Quick Evaluation

## 2013-11-23 NOTE — H&P (Signed)
History of Present Illness Tanya Lawson returns for follow-up. She has not passed a stone but she has not had any pain. She voids well KUB a month ago showed no stone in the bony pelvis. Will get CT scan pelvis to be sure there is no stone in the distal ureter. CT scan shows a 4 mm stone in the right distal ureter. She is also known to have bilateral renal calculi.   Past Medical History Problems  1. History of Anxiety (Symptom) (799.2) 2. History of Calculus of ureter (592.1) 3. History of hypercholesterolemia (V12.29) 4. History of hypertension (V12.59) 5. History of Murmur (785.2)  Surgical History Problems  1. History of Cervix Cryosurgery 2. History of Cesarean Section 3. History of Cystoscopy With Insertion Of Ureteral Stent Right 4. History of Cystoscopy With Ureteroscopy With Removal Of Calculus 5. History of Salpingo-oophorectomy Bilateral Removal Of Solitary Ovary And Tube 6. History of Tonsillectomy  Current Meds 1. Atorvastatin Calcium 20 MG Oral Tablet;  Therapy: 17Sep2014 to Recorded 2. Hydrochlorothiazide 25 MG Oral Tablet; Take 1 tablet every day;  Therapy: [id number] to (Last Rx:08Jan2010) Ordered 3. Toprol XL 25 MG Oral Tablet Extended Release 24 Hour;  Therapy: (Recorded:06Dec2007) to Recorded 4. ValACYclovir HCl - 500 MG Oral Tablet;  Therapy: 14Apr2014 to Recorded 5. Zoloft 100 MG Oral Tablet;  Therapy: (Recorded:06Dec2007) to Recorded  Allergies Medication  1. Neomycin Sulfate TABS 2. Penicillins 3. Sulfa Drugs  Family History Problems  1. Family history of Acute Myocardial Infarction : Father 2. Family history of Breast Cancer (V16.3) : Mother 3. Family history of Family Health Status Number Of Children   1 son and 1 daughter 4. Family history of Hypertension : Mother 51. Family history of Ischemic Stroke (V17.1) : Mother 6. Family history of Ischemic Stroke (V17.1) : Father  Social History Problems  1. Alcohol Use   2/week 2. Caffeine  Use   1 3. Family history of Death In The Family Father   35 4. Marital History - Currently Married 5. 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 History of Tobacco Use (V15.82)   1 pack for 4 yrs  Review of Systems Genitourinary, constitutional, skin, eye, otolaryngeal, hematologic/lymphatic, cardiovascular, pulmonary, endocrine, musculoskeletal, gastrointestinal, neurological and psychiatric system(s) were reviewed and pertinent findings if present are noted.    Physical Exam Constitutional: Well nourished and well developed . No acute distress.  ENT:. The ears and nose are normal in appearance.  Neck: The appearance of the neck is normal and no neck mass is present.  Pulmonary: No respiratory distress and normal respiratory rhythm and effort.  Cardiovascular: Heart rate and rhythm are normal . No peripheral edema.  Abdomen: The abdomen is soft and nontender. No masses are palpated. No CVA tenderness. No hernias are palpable. No hepatosplenomegaly noted.  Genitourinary:  The bladder is non tender and not distended.  Lymphatics: The femoral and inguinal nodes are not enlarged or tender.  Skin: Normal skin turgor, no visible rash and no visible skin lesions.  Neuro/Psych:. Mood and affect are appropriate.    Results/Data Urine [Data Includes: Last 1 Day]   30Apr2015 COLOR YELLOW  APPEARANCE CLEAR  SPECIFIC GRAVITY 1.020  pH 6.5  GLUCOSE NEG mg/dL BILIRUBIN NEG  KETONE NEG mg/dL BLOOD TRACE  PROTEIN NEG mg/dL UROBILINOGEN 0.2 mg/dL NITRITE NEG  LEUKOCYTE ESTERASE NEG  SQUAMOUS EPITHELIAL/HPF RARE  WBC NONE SEEN WBC/hpf RBC 0-2 RBC/hpf BACTERIA NONE SEEN  CRYSTALS NONE SEEN  CASTS NONE SEEN   I independently reviewed the CT scan  and the findings are as noted above.   Assessment Assessed  1. Calculus of distal right ureter (592.1) 2. Nephrolithiasis (592.0)  Plan Calculus of distal right ureter  1. Follow-up Office  Follow-up  Patient will call back  Status: Hold For -  Appointment,Date of  Service  Requested for: 30Apr2015 2. CT-PELVIS W/O CONTRAST; Status:Complete;   Done: [id number] 12:00AM Health Maintenance  3. UA With REFLEX; [Do Not Release]; Status:Complete;   Done: [id number] 01:12PM  Needs CMET, PTH, uric acid. I discussed the treatment options of the right distal ureteral calculus: ESL versus stone manipulation. The risks, benefits of the options were reviewed with her. In view of the location of the stone I told her that I would favor stone manipulation. If she fails ESL she would still need stone manipulation. She is going to think about it and call me back next week with her decision.

## 2013-11-23 NOTE — Transfer of Care (Signed)
Immediate Anesthesia Transfer of Care Note  Patient: Tanya Lawson  Procedure(s) Performed: Procedure(s): CYSTOSCOPY WITH RETROGRADE PYELOGRAM, URETEROSCOPY, STONE MANIPULATION  AND STENT PLACEMENT (Right) HOLMIUM LASER APPLICATION (Right)  Patient Location: PACU  Anesthesia Type:General  Level of Consciousness: awake, alert  and oriented  Airway & Oxygen Therapy: Patient Spontanous Breathing and Patient connected to face mask oxygen  Post-op Assessment: Report given to PACU RN  Post vital signs: Reviewed and stable  Complications: No apparent anesthesia complications

## 2013-11-23 NOTE — Progress Notes (Signed)
Patient had to wait for husband to come for pick up is the reason patient stayed a long time in phase 35

## 2013-11-23 NOTE — Anesthesia Postprocedure Evaluation (Signed)
  Anesthesia Post-op Note  Patient: Tanya Lawson  Procedure(s) Performed: Procedure(s) (LRB): CYSTOSCOPY WITH RETROGRADE PYELOGRAM, URETEROSCOPY, STONE MANIPULATION  AND STENT PLACEMENT (Right)  Patient Location: PACU  Anesthesia Type: General  Level of Consciousness: awake and alert   Airway and Oxygen Therapy: Patient Spontanous Breathing  Post-op Pain: mild  Post-op Assessment: Post-op Vital signs reviewed, Patient's Cardiovascular Status Stable, Respiratory Function Stable, Patent Airway and No signs of Nausea or vomiting  Last Vitals:  Filed Vitals:   11/23/13 1034  BP: 117/66  Pulse: 53  Temp: 36.5 C  Resp: 13    Post-op Vital Signs: stable   Complications: No apparent anesthesia complications

## 2013-11-23 NOTE — Discharge Instructions (Signed)
° ° ° ° °  °  _____________________________________________________ CYSTOSCOPY HOME CARE INSTRUCTIONS  Activity: Rest for the remainder of the day.  Do not drive or operate equipment today.  You may resume normal activities in one to two days as instructed by your physician.   Meals: Drink plenty of liquids and eat light foods such as gelatin or soup this evening.  You may return to a normal meal plan tomorrow.  Return to Work: You may return to work in one to two days or as instructed by your physician.  Special Instructions / Symptoms: Call your physician if any of these symptoms occur:   -persistent or heavy bleeding  -bleeding which continues after first few urination  -large blood clots that are difficult to pass  -urine stream diminishes or stops completely  -fever equal to or higher than 101 degrees Farenheit.  -cloudy urine with a strong, foul odor  -severe pain  Females should always wipe from front to back after elimination.  You may feel some burning pain when you urinate.  This should disappear with time.  Applying moist heat to the lower abdomen or a hot tub bath may help relieve the pain. \  Follow-Up / Date of Return Visit to Your Physician:  *** Call for an appointment to arrange follow-up.  Patient Signature:  ________________________________________________________  Nurse's Signature:  ________________________________________________________  Post Anesthesia Home Care Instructions  Activity: Get plenty of rest for the remainder of the day. A responsible adult should stay with you for 24 hours following the procedure.  For the next 24 hours, DO NOT: -Drive a car -Paediatric nurse -Drink alcoholic beverages -Take any medication unless instructed by your physician -Make any legal decisions or sign important papers.  Meals: Start with liquid foods such as gelatin or soup. Progress to regular foods as tolerated. Avoid greasy, spicy, heavy foods. If nausea  and/or vomiting occur, drink only clear liquids until the nausea and/or vomiting subsides. Call your physician if vomiting continues.  Special Instructions/Symptoms: Your throat may feel dry or sore from the anesthesia or the breathing tube placed in your throat during surgery. If this causes discomfort, gargle with warm salt water. The discomfort should disappear within 24 hours.

## 2013-11-24 ENCOUNTER — Encounter (HOSPITAL_BASED_OUTPATIENT_CLINIC_OR_DEPARTMENT_OTHER): Payer: Self-pay | Admitting: Urology

## 2013-12-07 ENCOUNTER — Emergency Department (HOSPITAL_COMMUNITY)
Admission: EM | Admit: 2013-12-07 | Discharge: 2013-12-07 | Disposition: A | Discharge status: Home / Self Care | Payer: Managed Care, Other (non HMO) | Attending: Emergency Medicine | Admitting: Emergency Medicine

## 2013-12-07 ENCOUNTER — Encounter (HOSPITAL_COMMUNITY): Payer: Self-pay | Admitting: Emergency Medicine

## 2013-12-07 DIAGNOSIS — I10 Essential (primary) hypertension: Secondary | ICD-10-CM | POA: Insufficient documentation

## 2013-12-07 DIAGNOSIS — R112 Nausea with vomiting, unspecified: Secondary | ICD-10-CM | POA: Insufficient documentation

## 2013-12-07 DIAGNOSIS — E785 Hyperlipidemia, unspecified: Secondary | ICD-10-CM | POA: Insufficient documentation

## 2013-12-07 DIAGNOSIS — Z880 Allergy status to penicillin: Secondary | ICD-10-CM | POA: Insufficient documentation

## 2013-12-07 DIAGNOSIS — Z8619 Personal history of other infectious and parasitic diseases: Secondary | ICD-10-CM | POA: Insufficient documentation

## 2013-12-07 DIAGNOSIS — Z8719 Personal history of other diseases of the digestive system: Secondary | ICD-10-CM | POA: Insufficient documentation

## 2013-12-07 DIAGNOSIS — Z87442 Personal history of urinary calculi: Secondary | ICD-10-CM | POA: Insufficient documentation

## 2013-12-07 DIAGNOSIS — Z87891 Personal history of nicotine dependence: Secondary | ICD-10-CM | POA: Insufficient documentation

## 2013-12-07 DIAGNOSIS — R109 Unspecified abdominal pain: Secondary | ICD-10-CM | POA: Insufficient documentation

## 2013-12-07 DIAGNOSIS — G8918 Other acute postprocedural pain: Secondary | ICD-10-CM

## 2013-12-07 NOTE — Discharge Instructions (Signed)
Please follow up at your scheduled follow up appointment with Dr. Janice Norrie. Please take all of your home pain medications as prescribed by Dr. Janice Norrie. Please read all discharge instructions and return precautions.   Flank Pain Flank pain refers to pain that is located on the side of the body between the upper abdomen and the back. The pain may occur over a short period of time (acute) or may be long-term or reoccurring (chronic). It may be mild or severe. Flank pain can be caused by many things. CAUSES  Some of the more common causes of flank pain include:  Muscle strains.   Muscle spasms.   A disease of your spine (vertebral disk disease).   A lung infection (pneumonia).   Fluid around your lungs (pulmonary edema).   A kidney infection.   Kidney stones.   A very painful skin rash caused by the chickenpox virus (shingles).   Gallbladder disease.  Bourg care will depend on the cause of your pain. In general,  Rest as directed by your caregiver.  Drink enough fluids to keep your urine clear or pale yellow.  Only take over-the-counter or prescription medicines as directed by your caregiver. Some medicines may help relieve the pain.  Tell your caregiver about any changes in your pain.  Follow up with your caregiver as directed. SEEK IMMEDIATE MEDICAL CARE IF:   Your pain is not controlled with medicine.   You have new or worsening symptoms.  Your pain increases.   You have abdominal pain.   You have shortness of breath.   You have persistent nausea or vomiting.   You have swelling in your abdomen.   You feel faint or pass out.   You have blood in your urine.  You have a fever or persistent symptoms for more than 2-3 days.  You have a fever and your symptoms suddenly get worse. MAKE SURE YOU:   Understand these instructions.  Will watch your condition.  Will get help right away if you are not doing well or get  worse. Document Released: 07/04/2005 Document Revised: 02/05/2012 Document Reviewed: 12/26/2011 [facility] Patient Information 2015 [place], [place]. This information is not intended to replace advice given to you by your health care provider. Make sure you discuss any questions you have with your health care provider. Ureteral Stent Implantation, Care After Refer to this sheet in the next few weeks. These instructions provide you with information on caring for yourself after your procedure. Your health care provider may also give you more specific instructions. Your treatment has been planned according to current medical practices, but problems sometimes occur. Call your health care provider if you have any problems or questions after your procedure. WHAT TO EXPECT AFTER THE PROCEDURE You should be back to normal activity within 48 hours after the procedure. Nausea and vomiting may occur and are commonly the result of anesthesia. It is common to experience sharp pain in the back or lower abdomen and penis with voiding. This is caused by movement of the ends of the stent with the act of urinating.It usually goes away within minutes after you have stopped urinating. HOME CARE INSTRUCTIONS Make sure to drink plenty of fluids. You may have small amounts of bleeding, causing your urine to be red. This is normal. Certain movements may trigger pain or a feeling that you need to urinate. You may be given medicines to prevent infection or bladder spasms. Be sure to take all medicines as directed. Only  take over-the-counter or prescription medicines for pain, discomfort, or fever as directed by your health care provider. Do not take aspirin, as this can make bleeding worse. Your stent will be left in until the blockage is resolved. This may take 2 weeks or longer, depending on the reason for stent implantation. You may have an X-ray exam to make sure your ureter is open and that the stent has not moved out of  position (migrated). The stent can be removed by your health care provider in the office. Medicines may be given for comfort while the stent is being removed. Be sure to keep all follow-up appointments so your health care provider can check that you are healing properly. SEEK MEDICAL CARE IF:  You experience increasing pain.  Your pain medicine is not working. SEEK IMMEDIATE MEDICAL CARE IF:  Your urine is dark red or has blood clots.  You are leaking urine (incontinent).  You have a fever, chills, feeling sick to your stomach (nausea), or vomiting.  Your pain is not relieved by pain medicine.  The end of the stent comes out of the urethra.  You are unable to urinate. Document Released: 01/13/2013 Document Revised: 05/18/2013 Document Reviewed: 01/13/2013 [facility] Patient Information 2015 [place], [place]. This information is not intended to replace advice given to you by your health care provider. Make sure you discuss any questions you have with your health care provider.

## 2013-12-07 NOTE — ED Provider Notes (Signed)
CSN: [id number]     Arrival date & time 12/07/13  2038 History  This chart was scribed for non-physician practitioner, Gavin Pound, PA-C, working with Richarda Blade, MD, by Delphia Grates, ED Scribe. This patient was seen in room WTR1/WLPT1 and the patient's care was started at 9:28 PM.    Chief Complaint  Patient presents with  . Post-op Problem     The history is provided by the patient. No language interpreter was used.    HPI Comments: Tanya Lawson is a 48 y.o. female who presents to the Emergency Department for a post-op problem. Patient states she had a stent removed today at 1600 and states she began feeling sharp, right flank pain an hour later that continued until about 8 PM. There is associated nausea and emesis. She states she had 2 episodes of nonbloody nonbilious emesis, after last episode of emesis nausea resolved. She reports since being here, her symptoms have resolved. She denies fever, chills, urinary symptoms, or abdominal pain. Patient has history of HTN, kidney stones and hyperlipidemia. Patient is scheduled to follow up with Dr. Janice Norrie in three weeks for post operative appointment.   Past Medical History  Diagnosis Date  . Hypertension   . Right ureteral stone   . History of kidney stones   . Hyperlipidemia   . H/O cold sores   . TMJ (temporomandibular joint syndrome)    Past Surgical History  Procedure Laterality Date  . Cesarean section  1999  &  09-09-2001  . Laparoscopy with left salpingectomy w/ resection of ectopic pregnancy  04-27-2000  . Cysto/  right retrograde pyelogram/  right ureteroscopic laser lithotripsy stone extraction/  stent placement  05-02-2006   &   09-21-2007  . Tonsillectomy  1985  . Negative sleep study   2013  . Cystoscopy with retrograde pyelogram, ureteroscopy and stent placement Right 11/23/2013    Procedure: CYSTOSCOPY WITH RETROGRADE PYELOGRAM, URETEROSCOPY, [place];  Surgeon: Arvil Persons, MD;  Location: [facility];  Service: Urology;  Laterality: Right;   Family History  Problem Relation Age of Onset  . Heart attack Father   . Stroke Father   . Dementia Father   . Breast cancer Mother   . Hypertension Mother   . Hyperlipidemia Mother    History  Substance Use Topics  . Smoking status: Former Smoker -- 0.50 packs/day for 8 years    Types: Cigarettes    Quit date: 11/19/1988  . Smokeless tobacco: Never Used  . Alcohol Use: Yes     Comment: RARE   OB History   Grav Para Term Preterm Abortions TAB SAB Ect Mult Living                 Review of Systems  Gastrointestinal: Positive for nausea and vomiting. Negative for abdominal pain.  Genitourinary: Positive for flank pain.  All other systems reviewed and are negative.     Allergies  Almond meal; Neosporin; Other; Penicillins; and Sulfa antibiotics  Home Medications   Prior to Admission medications   Medication Sig Start Date End Date Taking? Authorizing Provider  atorvastatin (LIPITOR) 20 MG tablet Take 20 mg by mouth daily.   Yes Historical Provider, MD  hydrochlorothiazide (HYDRODIURIL) 25 MG tablet Take 25 mg by mouth every morning.    Yes Historical Provider, MD  HYDROcodone-acetaminophen (NORCO) 5-325 MG per tablet Take 1 tablet by mouth every 6 (six) hours as needed for moderate pain. 11/23/13  Yes Arvil Persons, MD  MAXALT 10 MG tablet Take 10 mg by mouth as needed for migraine.    Yes Historical Provider, MD  metoprolol succinate (TOPROL-XL) 50 MG 24 hr tablet Take 25 mg by mouth daily.  11/09/13  Yes Historical Provider, MD  sertraline (ZOLOFT) 100 MG tablet Take 100 mg by mouth daily.    Yes Historical Provider, MD  valACYclovir (VALTREX) 1000 MG tablet Take 1,000 mg by mouth daily.   Yes Historical Provider, MD   Triage Vitals: BP 142/74  Pulse 60  Temp(Src) 98.6 F (37 C) (Oral)  Resp 18  Ht 5\' 3"  (1.6 m)  Wt 204 lb (92.534 kg)  BMI 36.15 kg/m2  SpO2 98%  LMP  11/23/2013  Physical Exam  Nursing note and vitals reviewed. Constitutional: She is oriented to person, place, and time. She appears well-developed and well-nourished. No distress.  HENT:  Head: Normocephalic and atraumatic.  Right Ear: External ear normal.  Left Ear: External ear normal.  Nose: Nose normal.  Mouth/Throat: Oropharynx is clear and moist. No oropharyngeal exudate.  Eyes: Conjunctivae are normal.  Neck: Normal range of motion. Neck supple.  Cardiovascular: Normal rate, regular rhythm and normal heart sounds.   Pulmonary/Chest: Effort normal and breath sounds normal. No respiratory distress.  Abdominal: Soft. Bowel sounds are normal. She exhibits no distension. There is no tenderness. There is no rigidity, no rebound, no guarding and no CVA tenderness.  Musculoskeletal: Normal range of motion. She exhibits no edema.  Neurological: She is alert and oriented to person, place, and time.  Skin: Skin is warm and dry. She is not diaphoretic.  Psychiatric: She has a normal mood and affect.    ED Course  Procedures (including critical care time)  DIAGNOSTIC STUDIES: Oxygen Saturation is 98% on room air, normal by my interpretation.    COORDINATION OF CARE: 9:34 PM- Pt advised of plan for treatment and pt agrees.   Labs Review Labs Reviewed - No data to display  Imaging Review No results found.   EKG Interpretation None      MDM   Final diagnoses:  Post-operative pain    Filed Vitals:   12/07/13 2053  BP: 142/74  Pulse: 60  Temp: 98.6 F (37 C)  Resp: 18   Afebrile, NAD, non-toxic appearing, AAOx4. Abdomen soft, nontender, nondistended. No CVA tenderness. Patient denies any pain or nausea at this time. Declines in the pain medicine or nausea medication at this time.  Dr. Tresa Moore came into the ED to evaluate patient after being called by her. He recommended she come to the  ED for further evaluation. Dr. Tresa Moore states he offered patient hospitalization for  pain and symptom control but the patient declined. He recommends discharging the patient home with appropriate follow up and return precautions.  Will discharge patient at Dr. Zettie Pho and the patient's request with good return precautions given. Patient is agreeable to plan. Patient is stable at time of discharge    Harlow Mares, PA-C 12/07/13 2321

## 2013-12-07 NOTE — ED Notes (Signed)
Pt states that she had a kidney stone removed 2 weeks ago; pt states that she had the stent removed today around 4pm; pt states that around 5pm she started having sharp rt flank pain; pt states that the pain was really intense at home and caused her vomit; pt states that shortly before arriving the pain had subsided some; pt states that she called the on call MD and he advised her that she may be having ureter spasms or blood clots moving; pt is concerned that it feels similar to the kidney stone

## 2013-12-07 NOTE — ED Notes (Signed)
Dr Tresa Moore from Urology in to see pt

## 2013-12-07 NOTE — ED Provider Notes (Signed)
Medical screening examination/treatment/procedure(s) were performed by non-physician practitioner and as supervising physician I was immediately available for consultation/collaboration.  Richarda Blade, MD 12/07/13 [phone]

## 2013-12-07 NOTE — Consult Note (Signed)
Reason for Consult: Flank Pain, Nausea after Ureteral Stent Pull  Referring Physician: E. Eulis Foster MD  Tanya Lawson is an 48 y.o. female.   HPI:   1 - Nausea, Flank Pain After Ureteral Stent Pull - Pt s/p right ureteroscopy with laser lithotripsy and ureteral stent placement 11/23/2013 by Dr. Janice Norrie for Rt 4.91mm ureteral stone that failed to progress on medical therapy. Has several small right intrarenal stones as well. Underwent office cysto stent pull today but developed 8/10 right flank pain and some nausea with emesis several hours later. Called on-call line and encouraged to come to ER for eval as symptoms at time were refractory.    NO fevers. Voiding w/o dysuria.   Today Tanya Lawson is seen in consultation for above. Since her time in ER triage her pain and nausea have completely resolved for >2hrs.    Past Medical History  Diagnosis Date  . Hypertension   . Right ureteral stone   . History of kidney stones   . Hyperlipidemia   . H/O cold sores   . TMJ (temporomandibular joint syndrome)     Past Surgical History  Procedure Laterality Date  . Cesarean section  1999  &  09-09-2001  . Laparoscopy with left salpingectomy w/ resection of ectopic pregnancy  04-27-2000  . Cysto/  right retrograde pyelogram/  right ureteroscopic laser lithotripsy stone extraction/  stent placement  05-02-2006   &   09-21-2007  . Tonsillectomy  1985  . Negative sleep study   2013  . Cystoscopy with retrograde pyelogram, ureteroscopy and stent placement Right 11/23/2013    Procedure: CYSTOSCOPY WITH RETROGRADE PYELOGRAM, URETEROSCOPY, [place];  Surgeon: Arvil Persons, MD;  Location: [facility];  Service: Urology;  Laterality: Right;    Family History  Problem Relation Age of Onset  . Heart attack Father   . Stroke Father   . Dementia Father   . Breast cancer Mother   . Hypertension Mother   . Hyperlipidemia Mother     Social History:  reports that she  quit smoking about 25 years ago. Her smoking use included Cigarettes. She has a 4 pack-year smoking history. She has never used smokeless tobacco. She reports that she drinks alcohol. She reports that she does not use illicit drugs.  Allergies:  Allergies  Allergen Reactions  . Almond Meal Other (See Comments)    Almonds and avacodos causes stomach cramps  . Neosporin [Neomycin-Bacitracin Zn-Polymyx] Hives  . Other Other (See Comments)    BANANA-- MOUTH ITCHES POSITIVE ALMOND ALLERGY TEST  . Penicillins Hives and Swelling  . Sulfa Antibiotics Hives and Swelling    Medications: I have reviewed the patient's current medications.  No results found for this or any previous visit (from the past 48 hour(s)).  No results found.  Review of Systems  Constitutional: Negative.  Negative for fever and chills.  HENT: Negative.   Eyes: Negative.   Respiratory: Negative.   Cardiovascular: Negative.   Gastrointestinal: Positive for nausea and vomiting.  Genitourinary: Positive for flank pain.  Musculoskeletal: Negative.   Skin: Negative.   Neurological: Negative.   Endo/Heme/Allergies: Negative.   Psychiatric/Behavioral: Negative.    Blood pressure 142/74, pulse 60, temperature 98.6 F (37 C), temperature source Oral, resp. rate 18, height 5\' 3"  (1.6 m), weight 92.534 kg (204 lb), last menstrual period 11/23/2013, SpO2 98.00%. Physical Exam  Constitutional: She is oriented to person, place, and time. She appears well-developed and well-nourished.  HENT:  Head: Normocephalic and atraumatic.  Eyes: EOM are normal. Pupils are equal, round, and reactive to light.  Neck: Normal range of motion. Neck supple.  Cardiovascular: Normal rate.   Respiratory: Effort normal.  GI: Soft. Bowel sounds are normal.  Genitourinary:  No CVAT whatsoever  Musculoskeletal: Normal range of motion.  Neurological: She is alert and oriented to person, place, and time.  Skin: Skin is warm and dry.   Psychiatric: She has a normal mood and affect. Her behavior is normal. Judgment and thought content normal.    Assessment/Plan:  1 - Nausea, Flank Pain After Ureteral Stent Pull - Discussed DDx of likely ureteral spasms v. Debris / clot colic v. New dropped stone in order of decreasing liklihood. Discussed management options of admission for pain control and imaging in AM if worsened v. Continued outpatient management. She adamantly wants to continue outpatient management. Her exam is reassuring. She has nausea and pain meds at home.  I feel she is safe for DC to home with Urol f/u as previously scheduled.  Encouraged to call us again if symptoms return / refractory or if becomes febrile.   Ewin Rehberg 12/07/2013, 9:28 PM

## 2014-07-24 ENCOUNTER — Encounter (HOSPITAL_COMMUNITY): Payer: Self-pay | Admitting: Emergency Medicine

## 2014-07-24 ENCOUNTER — Emergency Department (HOSPITAL_COMMUNITY)
Admission: EM | Admit: 2014-07-24 | Discharge: 2014-07-24 | Disposition: A | Discharge status: Home / Self Care | Payer: Managed Care, Other (non HMO) | Attending: Emergency Medicine | Admitting: Emergency Medicine

## 2014-07-24 DIAGNOSIS — E785 Hyperlipidemia, unspecified: Secondary | ICD-10-CM | POA: Diagnosis not present

## 2014-07-24 DIAGNOSIS — L299 Pruritus, unspecified: Secondary | ICD-10-CM | POA: Insufficient documentation

## 2014-07-24 DIAGNOSIS — Z79899 Other long term (current) drug therapy: Secondary | ICD-10-CM | POA: Diagnosis not present

## 2014-07-24 DIAGNOSIS — T39315A Adverse effect of propionic acid derivatives, initial encounter: Secondary | ICD-10-CM | POA: Diagnosis not present

## 2014-07-24 DIAGNOSIS — Z880 Allergy status to penicillin: Secondary | ICD-10-CM | POA: Insufficient documentation

## 2014-07-24 DIAGNOSIS — Z87891 Personal history of nicotine dependence: Secondary | ICD-10-CM | POA: Insufficient documentation

## 2014-07-24 DIAGNOSIS — T7840XA Allergy, unspecified, initial encounter: Secondary | ICD-10-CM

## 2014-07-24 DIAGNOSIS — I10 Essential (primary) hypertension: Secondary | ICD-10-CM | POA: Diagnosis not present

## 2014-07-24 DIAGNOSIS — Z87442 Personal history of urinary calculi: Secondary | ICD-10-CM | POA: Diagnosis not present

## 2014-07-24 DIAGNOSIS — Z8739 Personal history of other diseases of the musculoskeletal system and connective tissue: Secondary | ICD-10-CM | POA: Insufficient documentation

## 2014-07-24 DIAGNOSIS — R220 Localized swelling, mass and lump, head: Secondary | ICD-10-CM | POA: Insufficient documentation

## 2014-07-24 MED ORDER — EPINEPHRINE 0.3 MG/0.3ML IJ SOAJ: 0.3000 mg | Freq: Once | INTRAMUSCULAR | Status: AC

## 2014-07-24 MED ORDER — DIPHENHYDRAMINE HCL 25 MG PO TABS: 25.0000 mg | ORAL_TABLET | Freq: Four times a day (QID) | ORAL | Status: DC

## 2014-07-24 MED ORDER — FAMOTIDINE 20 MG PO TABS
20.0000 mg | ORAL_TABLET | Freq: Two times a day (BID) | ORAL | Status: DC
Start: 2014-07-24 — End: 2018-12-16

## 2014-07-24 MED ORDER — METHYLPREDNISOLONE SODIUM SUCC 125 MG IJ SOLR
125.0000 mg | Freq: Once | INTRAMUSCULAR | Status: AC
  Administered 2014-07-24: 125 mg via INTRAVENOUS
  Filled 2014-07-24: qty 2

## 2014-07-24 MED ORDER — FAMOTIDINE IN NACL 20-0.9 MG/50ML-% IV SOLN
20.0000 mg | Freq: Once | INTRAVENOUS | Status: AC
  Administered 2014-07-24: 20 mg via INTRAVENOUS
  Filled 2014-07-24: qty 50

## 2014-07-24 MED ORDER — PREDNISONE 20 MG PO TABS: 60.0000 mg | ORAL_TABLET | Freq: Every day | ORAL | Status: DC

## 2014-07-24 NOTE — ED Notes (Signed)
Patient states she woke up at 0155 with very itchy right inner ear and throat on the right side. Patient states she tried to go back to sleep but couldn't. Patient states she took 2-25mg  benadryl which has relieved the itching. Patient states now her greatest complaint is difficulty swallowing, that she is gagging and spitting up mucous. Patient states that she recently started taking Naproxen for back pain.

## 2014-07-24 NOTE — Discharge Instructions (Signed)
Drug Allergy °Allergic reactions to medicines are common. Some allergic reactions are mild. A delayed type of drug allergy that occurs 1 week or more after exposure to a medicine or vaccine is called serum sickness. A life-threatening, sudden (acute) allergic reaction that involves the whole body is called anaphylaxis. °CAUSES  °"True" drug allergies occur when there is an allergic reaction to a medicine. This is caused by overactivity of the immune system. First, the body becomes sensitized. The immune system is triggered by your first exposure to the medicine. Following this first exposure, future exposure to the same medicine may be life-threatening. °Almost any medicine can cause an allergic reaction. Common ones are: °· Penicillin. °· Sulfonamides (sulfa drugs). °· Local anesthetics. °· X-ray dyes that contain iodine. °SYMPTOMS  °Common symptoms of a minor allergic reaction are: °· Swelling around the mouth. °· An itchy red rash or hives. °· Vomiting or diarrhea. °Anaphylaxis can cause swelling of the mouth and throat. This makes it difficult to breathe and swallow. Severe reactions can be fatal within seconds, even after exposure to only a trace amount of the drug that causes the reaction. °HOME CARE INSTRUCTIONS  °· If you are unsure of what caused your reaction, keep a diary of foods and medicines used. Include the symptoms that followed. Avoid anything that causes reactions. °· You may want to follow up with an allergy specialist after the reaction has cleared in order to be tested to confirm the allergy. It is important to confirm that your reaction is an allergy, not just a side effect to the medicine. If you have a true allergy to a medicine, this may prevent that medicine and related medicines from being given to you when you are very ill. °· If you have hives or a rash: °¨ Take medicines as directed by your caregiver. °¨ You may use an over-the-counter antihistamine (diphenhydramine) as  needed. °¨ Apply cold compresses to the skin or take baths in cool water. Avoid hot baths or showers. °· If you are severely allergic: °¨ Continuous observation after a severe reaction may be needed. Hospitalization is often required. °¨ Wear a medical alert bracelet or necklace stating your allergy. °¨ You and your family must learn how to use an anaphylaxis kit or give an epinephrine injection to temporarily treat an emergency allergic reaction. If you have had a severe reaction, always carry your epinephrine injection or anaphylaxis kit with you. This can be lifesaving if you have a severe reaction. °· Do not drive or perform tasks after treatment until the medicines used to treat your reaction have worn off, or until your caregiver says it is okay. °SEEK MEDICAL CARE IF:  °· You think you had an allergic reaction. Symptoms usually start within 30 minutes after exposure. °· Symptoms are getting worse rather than better. °· You develop new symptoms. °· The symptoms that brought you to your caregiver return. °SEEK IMMEDIATE MEDICAL CARE IF:  °· You have swelling of the mouth, difficulty breathing, or wheezing. °· You have a tight feeling in your chest or throat. °· You develop hives, swelling, or itching all over your body. °· You develop severe vomiting or diarrhea. °· You feel faint or pass out. °This is an emergency. Use your epinephrine injection or anaphylaxis kit as you have been instructed. Call for emergency medical help. Even if you improve after the injection, you need to be examined at a hospital emergency department. °MAKE SURE YOU:  °· Understand these instructions. °· Will watch   your condition.  Will get help right away if you are not doing well or get worse. Document Released: 05/13/2005 Document Revised: 08/05/2011 Document Reviewed: 10/17/2010 [facility] Patient Information 2015 [place], [place]. This information is not intended to replace advice given to you by your health care provider. Make  sure you discuss any questions you have with your health care provider.

## 2014-07-24 NOTE — ED Provider Notes (Signed)
CSN: [id number]     Arrival date & time 07/24/14  0401 History   First MD Initiated Contact with Patient 07/24/14 [phone]     Chief Complaint  Patient presents with  . Allergic Reaction    difficulty swallowing, itching     (Consider location/radiation/quality/duration/timing/severity/associated sxs/prior Treatment) HPI Comments: Woke this morning around 1:30 with itching to right ear and tightening of her right throat. She took Benadryl prior to arrival and the itching is improved but throat still feels like it is difficult to swallow. No SOB or rash. She has been taking Naprosyn for back pain - has had two doses. No other new medications.   Patient is a 49 y.o. female presenting with allergic reaction. The history is provided by the patient.  Allergic Reaction Presenting symptoms: difficulty swallowing and itching   Presenting symptoms: no difficulty breathing and no rash   Severity:  Moderate   Past Medical History  Diagnosis Date  . Hypertension   . Right ureteral stone   . History of kidney stones   . Hyperlipidemia   . H/O cold sores   . TMJ (temporomandibular joint syndrome)    Past Surgical History  Procedure Laterality Date  . Cesarean section  1999  &  09-09-2001  . Laparoscopy with left salpingectomy w/ resection of ectopic pregnancy  04-27-2000  . Cysto/  right retrograde pyelogram/  right ureteroscopic laser lithotripsy stone extraction/  stent placement  05-02-2006   &   09-21-2007  . Tonsillectomy  1985  . Negative sleep study   2013  . Cystoscopy with retrograde pyelogram, ureteroscopy and stent placement Right 11/23/2013    Procedure: CYSTOSCOPY WITH RETROGRADE PYELOGRAM, URETEROSCOPY, [place];  Surgeon: Arvil Persons, MD;  Location: [facility];  Service: Urology;  Laterality: Right;   Family History  Problem Relation Age of Onset  . Heart attack Father   . Stroke Father   . Dementia Father   . Breast cancer Mother    . Hypertension Mother   . Hyperlipidemia Mother    History  Substance Use Topics  . Smoking status: Former Smoker -- 0.50 packs/day for 8 years    Types: Cigarettes    Quit date: 11/19/1988  . Smokeless tobacco: Never Used  . Alcohol Use: Yes     Comment: RARE   OB History    No data available     Review of Systems  Constitutional: Negative for fever and chills.  HENT: Positive for trouble swallowing. Negative for sore throat.   Respiratory: Negative.  Negative for shortness of breath.   Cardiovascular: Negative.   Gastrointestinal: Negative.  Negative for nausea.  Musculoskeletal: Negative.   Skin: Positive for itching. Negative for rash.  Neurological: Negative.  Negative for syncope.      Allergies  Almond meal; Neosporin; Other; Penicillins; and Sulfa antibiotics  Home Medications   Prior to Admission medications   Medication Sig Start Date End Date Taking? Authorizing Provider  atorvastatin (LIPITOR) 20 MG tablet Take 20 mg by mouth daily.    Historical Provider, MD  hydrochlorothiazide (HYDRODIURIL) 25 MG tablet Take 25 mg by mouth every morning.     Historical Provider, MD  HYDROcodone-acetaminophen (NORCO) 5-325 MG per tablet Take 1 tablet by mouth every 6 (six) hours as needed for moderate pain. 11/23/13   Arvil Persons, MD  MAXALT 10 MG tablet Take 10 mg by mouth as needed for migraine.     Historical Provider, MD  metoprolol succinate (TOPROL-XL) 50 MG 24 hr tablet Take 25 mg by mouth daily.  11/09/13   Historical Provider, MD  sertraline (ZOLOFT) 100 MG tablet Take 100 mg by mouth daily.     Historical Provider, MD  valACYclovir (VALTREX) 1000 MG tablet Take 1,000 mg by mouth daily.    Historical Provider, MD   BP 189/93 mmHg  Pulse 65  Resp 18  SpO2 100%  LMP 07/22/2014 (Approximate) Physical Exam  Constitutional: She is oriented to person, place, and time. She appears well-developed and well-nourished. No distress.  HENT:  Head: Normocephalic.  Right  Ear: Tympanic membrane and external ear normal.  Left Ear: Tympanic membrane normal.  Mouth/Throat: Uvula is midline and oropharynx is clear and moist. Mucous membranes are not dry. No oropharyngeal exudate.  Eyes: Conjunctivae are normal.  Neck: Normal range of motion. No thyromegaly present.  Cardiovascular: Normal rate.   No murmur heard. Pulmonary/Chest: Effort normal. No stridor. She has no wheezes. She has no rales.  Abdominal: Soft. Bowel sounds are normal. There is no tenderness.  Musculoskeletal: Normal range of motion.  Lymphadenopathy:    She has no cervical adenopathy.  Neurological: She is alert and oriented to person, place, and time.  Skin: Skin is warm and dry. No rash noted.  Psychiatric: She has a normal mood and affect.    ED Course  Procedures (including critical care time) Labs Review Labs Reviewed - No data to display  Imaging Review No results found.   EKG Interpretation None      MDM   Final diagnoses:  None    1. Allergic reaction  She is feeling improved with medications given in ED. Still has mild fullness in throat but improved. Discussed return precautions and close pcp follow up.     Dewaine Oats, PA-C 07/24/14 2863  Everlene Balls, MD 07/24/14 0700

## 2016-03-19 ENCOUNTER — Encounter: Payer: Self-pay | Admitting: Internal Medicine

## 2017-07-16 ENCOUNTER — Other Ambulatory Visit: Payer: Self-pay | Admitting: Obstetrics and Gynecology

## 2017-07-16 DIAGNOSIS — Z803 Family history of malignant neoplasm of breast: Secondary | ICD-10-CM

## 2017-08-12 ENCOUNTER — Encounter: Payer: Self-pay | Admitting: Obstetrics and Gynecology

## 2017-09-01 ENCOUNTER — Encounter: Payer: Self-pay | Admitting: Gastroenterology

## 2017-10-06 ENCOUNTER — Encounter: Payer: Self-pay | Admitting: *Deleted

## 2017-10-22 ENCOUNTER — Other Ambulatory Visit: Payer: Self-pay

## 2017-10-22 ENCOUNTER — Ambulatory Visit (AMBULATORY_SURGERY_CENTER): Payer: Self-pay

## 2017-10-22 VITALS — Ht 63.0 in | Wt 221.4 lb

## 2017-10-22 DIAGNOSIS — Z1211 Encounter for screening for malignant neoplasm of colon: Secondary | ICD-10-CM

## 2017-10-22 MED ORDER — PEG 3350-KCL-NA BICARB-NACL 420 G PO SOLR: 4000.0000 mL | Freq: Once | ORAL | 0 refills | Status: AC

## 2017-10-22 NOTE — Progress Notes (Signed)
No egg or soy allergy known to patient  No issues with past sedation with any surgeries  or procedures, no intubation problems  No diet pills per patient No home 02 use per patient  No blood thinners per patient  Pt denies issues with constipation  No A fib or A flutter  EMMI video sent to pt's e mail , sent to email  

## 2017-10-31 ENCOUNTER — Encounter: Payer: Self-pay | Admitting: Gastroenterology

## 2017-11-05 ENCOUNTER — Ambulatory Visit (AMBULATORY_SURGERY_CENTER): Payer: 59 | Admitting: Gastroenterology

## 2017-11-05 ENCOUNTER — Other Ambulatory Visit: Payer: Self-pay

## 2017-11-05 ENCOUNTER — Encounter: Payer: Self-pay | Admitting: Gastroenterology

## 2017-11-05 VITALS — BP 125/75 | HR 56 | Temp 98.0°F | Resp 36 | Ht 63.0 in | Wt 221.0 lb

## 2017-11-05 DIAGNOSIS — Z1211 Encounter for screening for malignant neoplasm of colon: Secondary | ICD-10-CM

## 2017-11-05 DIAGNOSIS — D123 Benign neoplasm of transverse colon: Secondary | ICD-10-CM

## 2017-11-05 DIAGNOSIS — D122 Benign neoplasm of ascending colon: Secondary | ICD-10-CM | POA: Diagnosis not present

## 2017-11-05 MED ORDER — SODIUM CHLORIDE 0.9 % IV SOLN: 500.0000 mL | Freq: Once | INTRAVENOUS | Status: DC

## 2017-11-05 NOTE — Patient Instructions (Signed)
**  Handout given on polyps**  *No Aspirin or NSAIDS (advil, aleve, motrin, naproxen, etc...) for 10 days*  YOU HAD AN ENDOSCOPIC PROCEDURE TODAY: Refer to the procedure report and other information in the discharge instructions given to you for any specific questions about what was found during the examination. If this information does not answer your questions, please call [place] office at [phone] to clarify.   YOU SHOULD EXPECT: Some feelings of bloating in the abdomen. Passage of more gas than usual. Walking can help get rid of the air that was put into your GI tract during the procedure and reduce the bloating. If you had a lower endoscopy (such as a colonoscopy or flexible sigmoidoscopy) you may notice spotting of blood in your stool or on the toilet paper. Some abdominal soreness may be present for a day or two, also.  DIET: Your first meal following the procedure should be a light meal and then it is ok to progress to your normal diet. A half-sandwich or bowl of soup is an example of a good first meal. Heavy or fried foods are harder to digest and may make you feel nauseous or bloated. Drink plenty of fluids but you should avoid alcoholic beverages for 24 hours. If you had a esophageal dilation, please see attached instructions for diet.    ACTIVITY: Your care partner should take you home directly after the procedure. You should plan to take it easy, moving slowly for the rest of the day. You can resume normal activity the day after the procedure however YOU SHOULD NOT DRIVE, use power tools, machinery or perform tasks that involve climbing or major physical exertion for 24 hours (because of the sedation medicines used during the test).   SYMPTOMS TO REPORT IMMEDIATELY: A gastroenterologist can be reached at any hour. Please call [phone]  for any of the following symptoms:  Following lower endoscopy (colonoscopy, flexible sigmoidoscopy) Excessive amounts of blood in the stool   Significant tenderness, worsening of abdominal pains  Swelling of the abdomen that is new, acute  Fever of 100 or higher    FOLLOW UP:  If any biopsies were taken you will be contacted by phone or by letter within the next 1-3 weeks. Call [phone]  if you have not heard about the biopsies in 3 weeks.  Please also call with any specific questions about appointments or follow up tests.

## 2017-11-05 NOTE — Progress Notes (Signed)
I have reviewed the patient's medical history in detail and updated the computerized patient record.

## 2017-11-05 NOTE — Progress Notes (Signed)
Report to PACU, RN, vss, BBS= Clear.  

## 2017-11-05 NOTE — Op Note (Signed)
[place] Patient Name: Tanya Lawson Procedure Date: 11/05/2017 9:42 AM MRN: [id number] Endoscopist: Milus Banister , MD Age: 52 Referring MD:  Date of Birth: 04-30-1966 Gender: Female Account #: [id number] Procedure:                Colonoscopy Indications:              Screening for colorectal malignant neoplasm Medicines:                Monitored Anesthesia Care Procedure:                Pre-Anesthesia Assessment:                           - Prior to the procedure, a History and Physical                            was performed, and patient medications and                            allergies were reviewed. The patient's tolerance of                            previous anesthesia was also reviewed. The risks                            and benefits of the procedure and the sedation                            options and risks were discussed with the patient.                            All questions were answered, and informed consent                            was obtained. Prior Anticoagulants: The patient has                            taken no previous anticoagulant or antiplatelet                            agents. ASA Grade Assessment: II - A patient with                            mild systemic disease. After reviewing the risks                            and benefits, the patient was deemed in                            satisfactory condition to undergo the procedure.                           After obtaining informed consent, the colonoscope  was passed under direct vision. Throughout the                            procedure, the patient's blood pressure, pulse, and                            oxygen saturations were monitored continuously. The                            Model PCF-[id number]) scope was introduced                            through the anus and advanced to the the cecum,                            identified by  appendiceal orifice and ileocecal                            valve. The colonoscopy was performed without                            difficulty. The patient tolerated the procedure                            well. The quality of the bowel preparation was                            good. The ileocecal valve, appendiceal orifice, and                            rectum were photographed. Scope In: 9:45:09 AM Scope Out: 10:04:40 AM Scope Withdrawal Time: 0 hours 15 minutes 44 seconds  Total Procedure Duration: 0 hours 19 minutes 31 seconds  Findings:                 One 16 mm polyp in the ascending colon, removed                            piecemeal using mixed snare cautery, cold snare                            methods. Resected and retrieved (jar 1) The site                            was tattooed with Spot.                           A 11 mm polyp was found in the transverse colon.                            The polyp was semi-pedunculated. The polyp was                            removed with a hot snare (jar 2).  Resection and                            retrieval were complete.                           The exam was otherwise without abnormality on                            direct and retroflexion views. Complications:            No immediate complications. Estimated blood loss:                            None. Estimated Blood Loss:     Estimated blood loss: none. Impression:               - One 16 mm polyp in the ascending colon, removed                            piecemeal using mixed snare/cautery, cold snare                            methods. Resected and retrieved. The site was                            tattooed with Spot.                           - One 11 mm polyp in the transverse colon, removed                            with a hot snare. Resected and retrieved.                           - The examination was otherwise normal on direct                            and  retroflexion views. Recommendation:           - Patient has a contact number available for                            emergencies. The signs and symptoms of potential                            delayed complications were discussed with the                            patient. Return to normal activities tomorrow.                            Written discharge instructions were provided to the                            patient.                           -  Resume previous diet.                           - Continue present medications.                           You will receive a letter within 2-3 weeks with the                            pathology results and my final recommendations.                           If the polyp(s) is proven to be 'pre-cancerous' on                            pathology, you will need repeat colonoscopy in 6                            months (given the piecemeal resection needed to                            removed the ascending polyp) Milus Banister, MD 11/05/2017 10:12:21 AM This report has been signed electronically.

## 2017-11-05 NOTE — Progress Notes (Signed)
Called to room to assist during endoscopic procedure.  Patient ID and intended procedure confirmed with present staff. Received instructions for my participation in the procedure from the performing physician.  

## 2017-11-06 ENCOUNTER — Telehealth: Payer: Self-pay

## 2017-11-06 NOTE — Telephone Encounter (Signed)
  Follow up Call-  Call back number 11/05/2017  Post procedure Call Back phone  # [phone]  Permission to leave phone message Yes  Some recent data might be hidden     Patient questions:  Do you have a fever, pain , or abdominal swelling? No. Pain Score  0 *  Have you tolerated food without any problems? Yes.    Have you been able to return to your normal activities? Yes.    Do you have any questions about your discharge instructions: Diet   No. Medications  No. Follow up visit  No.  Do you have questions or concerns about your Care? No.  Actions: * If pain score is 4 or above: No action needed, pain <4.

## 2017-11-06 NOTE — Telephone Encounter (Signed)
Attempted to reach pt. With follow-up call following endoscopic procedure 11/05/2017.  LM on pt. Voice mail.  Will try to reach pt. Again later today.

## 2017-11-11 ENCOUNTER — Encounter: Payer: Self-pay | Admitting: Gastroenterology

## 2018-11-19 ENCOUNTER — Encounter: Payer: Self-pay | Admitting: Gastroenterology

## 2018-12-16 ENCOUNTER — Other Ambulatory Visit: Payer: Self-pay

## 2018-12-16 ENCOUNTER — Ambulatory Visit: Payer: 59 | Admitting: *Deleted

## 2018-12-16 VITALS — Ht 62.0 in | Wt 210.0 lb

## 2018-12-16 DIAGNOSIS — Z86010 Personal history of colonic polyps: Secondary | ICD-10-CM

## 2018-12-16 MED ORDER — PEG 3350-KCL-NA BICARB-NACL 420 G PO SOLR: 4000.0000 mL | Freq: Once | ORAL | 0 refills | Status: AC

## 2018-12-16 NOTE — Progress Notes (Signed)
Patient's pre-visit was done today over the phone with the patient due to COVID-19 pandemic. Name,DOB and address verified. Insurance verified. Packet of Prep instructions mailed to patient including copy of a consent form and pre-procedure patient acknowledgement form-pt is aware.  Patient understands to call us back with any questions or concerns.  Patient denies any allergies to eggs or soy. Patient denies any problems with anesthesia/sedation. Patient denies any oxygen use at home. Patient denies taking any diet/weight loss medications or blood thinners. Pt is aware that care partner will wait in the car during procedure; if they feel like they will be too hot to wait in the car; they may wait in the lobby.  We want them to wear a mask (we do not have any that we can provide them), practice social distancing, and we will check their temperatures when they get here.  I did remind patient that their care partner needs to stay in the parking lot the entire time. Pt will wear mask into building. 

## 2018-12-21 ENCOUNTER — Other Ambulatory Visit: Payer: Self-pay

## 2018-12-21 ENCOUNTER — Encounter: Payer: Self-pay | Admitting: Family Medicine

## 2018-12-21 ENCOUNTER — Ambulatory Visit (INDEPENDENT_AMBULATORY_CARE_PROVIDER_SITE_OTHER): Payer: 59 | Admitting: Family Medicine

## 2018-12-21 DIAGNOSIS — M542 Cervicalgia: Secondary | ICD-10-CM | POA: Diagnosis not present

## 2018-12-21 MED ORDER — PREDNISONE 10 MG PO TABS: ORAL_TABLET | ORAL | 0 refills | Status: DC

## 2018-12-21 NOTE — Progress Notes (Signed)
Office Visit Note   Patient: Tanya Lawson           Date of Birth: Nov 23, 1965           MRN: [id number] Visit Date: 12/21/2018 Requested by: Crist Infante, MD [address] [place],  [place] [address] PCP: Crist Infante, MD  Subjective: Chief Complaint  Patient presents with  . Neck - Pain    Woke up with pain in the right side of the neck 2 weeks ago. NKI. Radiates into the anterior/posterior shoulder and down the arm. Constant tingling in the right index finger. Right-hand dominant. PT, tried muscle relaxer and on prednisone taper.  . Right Arm - Pain    HPI: She is here with neck and right arm pain.  Symptoms started about 2 weeks ago, no injury.  She woke up 1 day with pain and it has not been hurting since then.  She went to her PCP and was given prednisone and muscle relaxant.  Symptoms were not improving much so she started physical therapy at [facility].  This is helped quite a bit and today she is actually feeling much better, but she has a persistent numbness in her index finger and her physical therapist felt it would be best for her to be reevaluated.  She is had neck problems in the past which responded to physical therapy.  She has been to a chiropractor in the past as well.               ROS: Denies fevers or chills.  All other systems were reviewed and are negative.  Objective: Vital Signs: There were no vitals taken for this visit.  Physical Exam:  General:  Alert and oriented, in no acute distress. Pulm:  Breathing unlabored. Psy:  Normal mood, congruent affect. Skin: No rash on her skin. Neck: Slightly positive Spurling's test on the right.  Good range of motion of her neck.  Tender in the paraspinous muscles on the right and in the trapezius belly.  Full range of motion of her shoulder with 5/5 rotator cuff, deltoid, bicep, wrist and hand strength but she has weakness with right triceps, 3/5.  Absent triceps DTR and 2+ biceps DTR.  Imaging: None today.   Assessment & Plan: 1.  Neck and right arm pain with triceps weakness, concerning for C7 radiculopathy. -Refilled prednisone, continue with physical therapy.  If not improving in 3 to 4 weeks she will contact me and I will order x-rays and MRI scan.     Procedures: No procedures performed  No notes on file     PMFS History: There are no active problems to display for this patient.  Past Medical History:  Diagnosis Date  . Allergy   . Anxiety   . H/O cold sores   . Heart murmur   . History of kidney stones   . Hyperlipidemia   . Hypertension   . Right ureteral stone   . TMJ (temporomandibular joint syndrome)     Family History  Problem Relation Age of Onset  . Heart attack Father   . Stroke Father   . Dementia Father   . Breast cancer Mother   . Hypertension Mother   . Hyperlipidemia Mother   . Colon polyps Neg Hx   . Colon cancer Neg Hx   . Esophageal cancer Neg Hx   . Stomach cancer Neg Hx   . Rectal cancer Neg Hx     Past Surgical History:  Procedure  Laterality Date  . [place]  &  09-09-2001  . CYSTO/  RIGHT RETROGRADE PYELOGRAM/  RIGHT URETEROSCOPIC LASER LITHOTRIPSY STONE EXTRACTION/  STENT PLACEMENT  05-02-2006   &   09-21-2007  . CYSTOSCOPY WITH RETROGRADE PYELOGRAM, URETEROSCOPY AND STENT PLACEMENT Right 11/23/2013   Procedure: CYSTOSCOPY WITH RETROGRADE PYELOGRAM, URETEROSCOPY, STONE MANIPULATION  AND STENT PLACEMENT;  Surgeon: Arvil Persons, MD;  Location: [facility];  Service: Urology;  Laterality: Right;  . LAPAROSCOPY WITH LEFT SALPINGECTOMY W/ RESECTION OF ECTOPIC PREGNANCY  04-27-2000  . NEGATIVE SLEEP STUDY   2013  . TONSILLECTOMY  1985   Social History   Occupational History  . Not on file  Tobacco Use  . Smoking status: Former Smoker    Packs/day: 0.50    Years: 8.00    Pack years: 4.00    Types: Cigarettes    Quit date: 11/19/1988    Years since quitting: 30.1  . Smokeless tobacco: Never Used  Substance and  Sexual Activity  . Alcohol use: Yes    Comment: RARE  . Drug use: No  . Sexual activity: Not on file

## 2018-12-29 ENCOUNTER — Telehealth: Payer: Self-pay | Admitting: Gastroenterology

## 2018-12-29 NOTE — Telephone Encounter (Signed)
NO answer and unable to leave message because mailbox is full regarding Covid-19 screening questions. Covid-19 Screening Questions:   Do you now or have you had a fever in the last 14 days?    Do you have any respiratory symptoms of shortness of breath or cough now or in the last 14 days?    Do you have any family members or close contacts with diagnosed  or suspected Covid-19 in the past 14 days?    Have you been tested for Covid-19 and found to be positive?

## 2018-12-30 ENCOUNTER — Encounter: Payer: Self-pay | Admitting: Gastroenterology

## 2018-12-30 ENCOUNTER — Ambulatory Visit (AMBULATORY_SURGERY_CENTER): Payer: 59 | Admitting: Gastroenterology

## 2018-12-30 ENCOUNTER — Other Ambulatory Visit: Payer: Self-pay

## 2018-12-30 VITALS — BP 103/64 | HR 64 | Temp 98.9°F | Resp 15 | Ht 62.0 in | Wt 210.0 lb

## 2018-12-30 DIAGNOSIS — D123 Benign neoplasm of transverse colon: Secondary | ICD-10-CM | POA: Diagnosis not present

## 2018-12-30 DIAGNOSIS — Z86010 Personal history of colonic polyps: Secondary | ICD-10-CM

## 2018-12-30 HISTORY — PX: COLONOSCOPY: SHX174

## 2018-12-30 MED ORDER — SODIUM CHLORIDE 0.9 % IV SOLN
500.0000 mL | Freq: Once | INTRAVENOUS | Status: DC
Start: 2018-12-30 — End: 2018-12-30

## 2018-12-30 NOTE — Progress Notes (Signed)
PT taken to PACU. Monitors in place. VSS. Report given to RN. 

## 2018-12-30 NOTE — Progress Notes (Signed)
[place]

## 2018-12-30 NOTE — Op Note (Signed)
[place] Patient Name: Tanya Lawson Procedure Date: 12/30/2018 7:56 AM MRN: [id number] Endoscopist: Milus Banister , MD Age: 53 Referring MD:  Date of Birth: 08/15/1965 Gender: Female Account #: [id number] Procedure:                Colonoscopy Indications:              High risk colon cancer surveillance: Personal                            history of colonic polyps; Colonoscopy 10/2017 Dr.                            Ardis Hughs found 1.6cm SSA piecemeal resected, tattoo'd                            (ascending colon) and 1.1cm TA (transverse) Medicines:                Monitored Anesthesia Care Procedure:                Pre-Anesthesia Assessment:                           - Prior to the procedure, a History and Physical                            was performed, and patient medications and                            allergies were reviewed. The patient's tolerance of                            previous anesthesia was also reviewed. The risks                            and benefits of the procedure and the sedation                            options and risks were discussed with the patient.                            All questions were answered, and informed consent                            was obtained. Prior Anticoagulants: The patient has                            taken no previous anticoagulant or antiplatelet                            agents. ASA Grade Assessment: II - A patient with                            mild systemic disease. After reviewing the risks  and benefits, the patient was deemed in                            satisfactory condition to undergo the procedure.                           After obtaining informed consent, the colonoscope                            was passed under direct vision. Throughout the                            procedure, the patient's blood pressure, pulse, and                            oxygen saturations  were monitored continuously. The                            Colonoscope was introduced through the anus and                            advanced to the the cecum, identified by                            appendiceal orifice and ileocecal valve. The                            colonoscopy was performed without difficulty. The                            patient tolerated the procedure well. The quality                            of the bowel preparation was excellent. Scope In: 8:20:04 AM Scope Out: 8:34:22 AM Scope Withdrawal Time: 0 hours 11 minutes 22 seconds  Total Procedure Duration: 0 hours 14 minutes 18 seconds  Findings:                 The site of 2019 ascending colon polypectomy was                            easily noted by aid of previous Spot tattoo. There                            was no recurrent or residual polyp at the site.                           A 4 mm polyp was found in the transverse colon. The                            polyp was sessile. The polyp was removed with a  cold snare. Resection and retrieval were complete.                           The exam was otherwise without abnormality on                            direct and retroflexion views. Complications:            No immediate complications. Estimated blood loss:                            None. Estimated Blood Loss:     Estimated blood loss: none. Impression:               - The site of 2019 ascending colon polypectomy was                            easily noted by aid of previous Spot tattoo. There                            was no recurrent or residual polyp at the site.                           - One 4 mm polyp in the transverse colon, removed                            with a cold snare. Resected and retrieved.                           - The examination was otherwise normal on direct                            and retroflexion views. Recommendation:           - Patient has a  contact number available for                            emergencies. The signs and symptoms of potential                            delayed complications were discussed with the                            patient. Return to normal activities tomorrow.                            Written discharge instructions were provided to the                            patient.                           - Resume previous diet.                           - Continue present medications.                           -  Repeat colonoscopy is recommended. The                            colonoscopy date will be determined after pathology                            results from today's exam become available for                            review. Likely in 2 years given HRAs 2019. Milus Banister, MD 12/30/2018 8:39:08 AM This report has been signed electronically.

## 2018-12-30 NOTE — Patient Instructions (Signed)
Discharge instructions given. Handout on polyps. Resume previous medications. YOU HAD AN ENDOSCOPIC PROCEDURE TODAY AT THE [facility]:   Refer to the procedure report that was given to you for any specific questions about what was found during the examination.  If the procedure report does not answer your questions, please call your gastroenterologist to clarify.  If you requested that your care partner not be given the details of your procedure findings, then the procedure report has been included in a sealed envelope for you to review at your convenience later.  YOU SHOULD EXPECT: Some feelings of bloating in the abdomen. Passage of more gas than usual.  Walking can help get rid of the air that was put into your GI tract during the procedure and reduce the bloating. If you had a lower endoscopy (such as a colonoscopy or flexible sigmoidoscopy) you may notice spotting of blood in your stool or on the toilet paper. If you underwent a bowel prep for your procedure, you may not have a normal bowel movement for a few days.  Please Note:  You might notice some irritation and congestion in your nose or some drainage.  This is from the oxygen used during your procedure.  There is no need for concern and it should clear up in a day or so.  SYMPTOMS TO REPORT IMMEDIATELY:   Following lower endoscopy (colonoscopy or flexible sigmoidoscopy):  Excessive amounts of blood in the stool  Significant tenderness or worsening of abdominal pains  Swelling of the abdomen that is new, acute  Fever of 100F or higher   For urgent or emergent issues, a gastroenterologist can be reached at any hour by calling [phone].   DIET:  We do recommend a small meal at first, but then you may proceed to your regular diet.  Drink plenty of fluids but you should avoid alcoholic beverages for 24 hours.  ACTIVITY:  You should plan to take it easy for the rest of today and you should NOT DRIVE or use heavy  machinery until tomorrow (because of the sedation medicines used during the test).    FOLLOW UP: Our staff will call the number listed on your records 48-72 hours following your procedure to check on you and address any questions or concerns that you may have regarding the information given to you following your procedure. If we do not reach you, we will leave a message.  We will attempt to reach you two times.  During this call, we will ask if you have developed any symptoms of COVID 19. If you develop any symptoms (ie: fever, flu-like symptoms, shortness of breath, cough etc.) before then, please call [phone].  If you test positive for Covid 19 in the 2 weeks post procedure, please call and report this information to us.    If any biopsies were taken you will be contacted by phone or by letter within the next 1-3 weeks.  Please call us at [phone] if you have not heard about the biopsies in 3 weeks.    SIGNATURES/CONFIDENTIALITY: You and/or your care partner have signed paperwork which will be entered into your electronic medical record.  These signatures attest to the fact that that the information above on your After Visit Summary has been reviewed and is understood.  Full responsibility of the confidentiality of this discharge information lies with you and/or your care-partner. 

## 2019-01-01 ENCOUNTER — Telehealth: Payer: Self-pay

## 2019-01-01 NOTE — Telephone Encounter (Signed)
  Follow up Call-  Call back number 12/30/2018 11/05/2017  Post procedure Call Back phone  # [phone]  Permission to leave phone message Yes Yes  Some recent data might be hidden     Patient questions:  Do you have a fever, pain , or abdominal swelling? No. Pain Score  0 *  Have you tolerated food without any problems? Yes.    Have you been able to return to your normal activities? Yes.    Do you have any questions about your discharge instructions: Diet   No. Medications  No. Follow up visit  No.  Do you have questions or concerns about your Care? No.  Actions: * If pain score is 4 or above: No action needed, pain <4.  1. Have you developed a fever since your procedure? no  2.   Have you had an respiratory symptoms (SOB or cough) since your procedure? no  3.   Have you tested positive for COVID 19 since your procedure no  4.   Have you had any family members/close contacts diagnosed with the COVID 19 since your procedure?  no   If yes to any of these questions please route to Joylene John, RN and Alphonsa Gin, Therapist, sports.

## 2019-01-10 ENCOUNTER — Encounter: Payer: Self-pay | Admitting: Gastroenterology

## 2019-01-13 ENCOUNTER — Other Ambulatory Visit: Payer: Self-pay

## 2019-01-13 ENCOUNTER — Telehealth: Payer: Self-pay | Admitting: Family Medicine

## 2019-01-13 ENCOUNTER — Ambulatory Visit: Payer: Self-pay

## 2019-01-13 ENCOUNTER — Other Ambulatory Visit: Payer: Self-pay | Admitting: Family Medicine

## 2019-01-13 DIAGNOSIS — M542 Cervicalgia: Secondary | ICD-10-CM

## 2019-01-13 NOTE — Telephone Encounter (Signed)
I advised the patient of the plan. She will await a call from [place].

## 2019-01-13 NOTE — Telephone Encounter (Signed)
Patient called stated that she needs MRI on index finger-sti;; pain and tingling.  Please call patient @ [phone]

## 2019-01-13 NOTE — Telephone Encounter (Signed)
Please advise. She has not had plain Csp xrays.

## 2019-01-13 NOTE — Telephone Encounter (Signed)
Neck x-rays and MRI ordered at [facility].

## 2019-01-19 ENCOUNTER — Ambulatory Visit
Admission: RE | Admit: 2019-01-19 | Discharge: 2019-01-19 | Disposition: A | Discharge status: Home / Self Care | Payer: 59 | Source: Ambulatory Visit | Attending: Family Medicine | Admitting: Family Medicine

## 2019-01-19 ENCOUNTER — Telehealth: Payer: Self-pay | Admitting: Family Medicine

## 2019-01-19 ENCOUNTER — Other Ambulatory Visit: Payer: Self-pay

## 2019-01-19 DIAGNOSIS — M542 Cervicalgia: Secondary | ICD-10-CM

## 2019-01-19 NOTE — Telephone Encounter (Signed)
MRI shows bulging disc at C4-5 and another at C6-7, but they do not appear to be causing any nerve impingement.  If numbness persists after another few weeks, will order nerve conduction studies.

## 2019-01-22 ENCOUNTER — Telehealth: Payer: Self-pay | Admitting: Family Medicine

## 2019-01-22 NOTE — Telephone Encounter (Signed)
I called and advised the patient of the MRI results. She is improving with PT. She has no pain - only a little numbness still in the index finger. The patient did have some numbness in the fingers of the left hand, but she took the Rx for prednisone taper that Dr. Junius Roads had given and she had not taken yet - it helped.  The MRI report will be faxed over to [facility] PT per patient request (Tammy in medical records is taking care of that).

## 2019-01-22 NOTE — Telephone Encounter (Signed)
01/19/19 MRI report faxed to [facility] [phone]

## 2019-06-06 ENCOUNTER — Ambulatory Visit: Payer: 59 | Attending: Internal Medicine

## 2019-06-06 DIAGNOSIS — Z23 Encounter for immunization: Secondary | ICD-10-CM | POA: Insufficient documentation

## 2019-06-06 NOTE — Progress Notes (Signed)
   Covid-19 Vaccination Clinic  Name:  Tanya Lawson    MRN: [id number] DOB: 09/19/1965  06/06/2019  Tanya Lawson was observed post Covid-19 immunization for 30 minutes based on pre-vaccination screening without incidence. She was provided with Vaccine Information Sheet and instruction to access the V-Safe system.   Tanya Lawson was instructed to call 911 with any severe reactions post vaccine: Marland Kitchen Difficulty breathing  . Swelling of your face and throat  . A fast heartbeat  . A bad rash all over your body  . Dizziness and weakness    Immunizations Administered    Name Date Dose VIS Date Route   Pfizer COVID-19 Vaccine 06/06/2019 12:55 PM 0.3 mL 05/07/2019 Intramuscular   Manufacturer: [place]   Lot: [id number]   [place]: [id number]

## 2019-06-26 ENCOUNTER — Ambulatory Visit: Payer: 59 | Attending: Internal Medicine

## 2019-06-26 DIAGNOSIS — Z23 Encounter for immunization: Secondary | ICD-10-CM | POA: Insufficient documentation

## 2019-06-26 NOTE — Progress Notes (Signed)
   Covid-19 Vaccination Clinic  Name:  Tanya Lawson    MRN: [id number] DOB: Aug 10, 1965  06/26/2019  [name] was observed post Covid-19 immunization for 30 minutes based on pre-vaccination screening without incidence. She was provided with Vaccine Information Sheet and instruction to access the V-Safe system.   [name] was instructed to call 911 with any severe reactions post vaccine: Marland Kitchen Difficulty breathing  . Swelling of your face and throat  . A fast heartbeat  . A bad rash all over your body  . Dizziness and weakness    Immunizations Administered    Name Date Dose VIS Date Route   Pfizer COVID-19 Vaccine 06/26/2019 11:16 AM 0.3 mL 05/07/2019 Intramuscular   Manufacturer: [place]   Lot: [id number]   [place]: [id number]

## 2019-08-23 ENCOUNTER — Encounter (HOSPITAL_COMMUNITY): Payer: Self-pay | Admitting: *Deleted

## 2019-08-23 ENCOUNTER — Other Ambulatory Visit: Payer: Self-pay

## 2019-08-23 ENCOUNTER — Emergency Department (HOSPITAL_COMMUNITY)
Admission: EM | Admit: 2019-08-23 | Discharge: 2019-08-23 | Disposition: A | Discharge status: Home / Self Care | Payer: 59 | Attending: Emergency Medicine | Admitting: Emergency Medicine

## 2019-08-23 DIAGNOSIS — Z87891 Personal history of nicotine dependence: Secondary | ICD-10-CM | POA: Diagnosis not present

## 2019-08-23 DIAGNOSIS — W541XXA Struck by dog, initial encounter: Secondary | ICD-10-CM | POA: Diagnosis not present

## 2019-08-23 DIAGNOSIS — I10 Essential (primary) hypertension: Secondary | ICD-10-CM | POA: Insufficient documentation

## 2019-08-23 DIAGNOSIS — R519 Headache, unspecified: Secondary | ICD-10-CM | POA: Diagnosis present

## 2019-08-23 DIAGNOSIS — Y92017 Garden or yard in single-family (private) house as the place of occurrence of the external cause: Secondary | ICD-10-CM | POA: Diagnosis not present

## 2019-08-23 DIAGNOSIS — Y999 Unspecified external cause status: Secondary | ICD-10-CM | POA: Diagnosis not present

## 2019-08-23 DIAGNOSIS — Z79899 Other long term (current) drug therapy: Secondary | ICD-10-CM | POA: Insufficient documentation

## 2019-08-23 DIAGNOSIS — Y93H2 Activity, gardening and landscaping: Secondary | ICD-10-CM | POA: Insufficient documentation

## 2019-08-23 DIAGNOSIS — S0990XA Unspecified injury of head, initial encounter: Secondary | ICD-10-CM | POA: Diagnosis not present

## 2019-08-23 NOTE — ED Triage Notes (Signed)
Pt reports having her 70lb dog hit her head at full speed. No loc. Now has headache, denies n/v.

## 2019-08-23 NOTE — ED Provider Notes (Signed)
[facility] EMERGENCY DEPARTMENT Provider Note   CSN: [id number] Arrival date & time: 08/23/19  1411     History Chief Complaint  Patient presents with  . Head Injury    Tanya Lawson is a 54 y.o. female with PMHx HTN, HLD, TMJ who presents to the ED today with complaint of gradual onset, constant, gradually improving, right sided headache s/p head injury that occurred about 2 hours ago. Pt reports that she was working in the garden when her 70 pound Restaurant manager, fast food puppy came running at full speed and ran into her, knocking heads. No LOC. Pt reports that a couple of minutes afterwards she began having a headache. No anticoagulations. She reports the headache is gradually improving. Started at a 5/10 and now is a 1/10. Denies vision changes, nausea, vomiting, weakness or numbness, speech changes, confusion, or any other associated symptoms.   The history is provided by the patient and medical records.       Past Medical History:  Diagnosis Date  . Allergy   . Anxiety   . H/O cold sores   . Heart murmur   . History of kidney stones   . Hyperlipidemia   . Hypertension   . Right ureteral stone   . TMJ (temporomandibular joint syndrome)     There are no problems to display for this patient.   Past Surgical History:  Procedure Laterality Date  . [place]  &  09-09-2001  . CYSTO/  RIGHT RETROGRADE PYELOGRAM/  RIGHT URETEROSCOPIC LASER LITHOTRIPSY STONE EXTRACTION/  STENT PLACEMENT  05-02-2006   &   09-21-2007  . CYSTOSCOPY WITH RETROGRADE PYELOGRAM, URETEROSCOPY AND STENT PLACEMENT Right 11/23/2013   Procedure: CYSTOSCOPY WITH RETROGRADE PYELOGRAM, URETEROSCOPY, STONE MANIPULATION  AND STENT PLACEMENT;  Surgeon: Arvil Persons, MD;  Location: [facility];  Service: Urology;  Laterality: Right;  . LAPAROSCOPY WITH LEFT SALPINGECTOMY W/ RESECTION OF ECTOPIC PREGNANCY  04-27-2000  . NEGATIVE SLEEP STUDY   2013  . TONSILLECTOMY  1985       OB History   No obstetric history on file.     Family History  Problem Relation Age of Onset  . Heart attack Father   . Stroke Father   . Dementia Father   . Breast cancer Mother   . Hypertension Mother   . Hyperlipidemia Mother   . Colon polyps Neg Hx   . Colon cancer Neg Hx   . Esophageal cancer Neg Hx   . Stomach cancer Neg Hx   . Rectal cancer Neg Hx     Social History   Tobacco Use  . Smoking status: Former Smoker    Packs/day: 0.50    Years: 8.00    Pack years: 4.00    Types: Cigarettes    Quit date: 11/19/1988    Years since quitting: 30.7  . Smokeless tobacco: Never Used  Substance Use Topics  . Alcohol use: Yes    Comment: RARE  . Drug use: No    Home Medications Prior to Admission medications   Medication Sig Start Date End Date Taking? Authorizing Provider  atorvastatin (LIPITOR) 20 MG tablet Take 20 mg by mouth daily.    [provider]  EPINEPHrine 0.3 mg/0.3 mL IJ SOAJ injection Inject 0.3 mLs (0.3 mg total) into the muscle once. 07/24/14   Charlann Lange, PA-C  fexofenadine (ALLEGRA) 180 MG tablet Take 180 mg by mouth daily.    [provider]  hydrochlorothiazide (HYDRODIURIL)  25 MG tablet Take 25 mg by mouth every morning.     [provider]  MAXALT 10 MG tablet Take 10 mg by mouth every 2 (two) hours as needed for migraine.     [provider]  metoprolol succinate (TOPROL-XL) 50 MG 24 hr tablet Take 25 mg by mouth daily.  11/09/13   [provider]  sertraline (ZOLOFT) 100 MG tablet Take 100 mg by mouth daily.     [provider]  valACYclovir (VALTREX) 1000 MG tablet Take 500 mg by mouth 2 (two) times daily.     [provider]    Allergies    Almond meal, Ibuprofen, Neosporin [neomycin-bacitracin zn-polymyx], Other, Penicillins, and Sulfa antibiotics  Review of Systems   Review of Systems  Constitutional: Negative for chills and fever.  Neurological: Positive for headaches.  Negative for dizziness, syncope, weakness, light-headedness and numbness.    Physical Exam Updated Vital Signs BP 130/69 (BP Location: Left Arm)   Pulse 61   Temp 98.6 F (37 C) (Oral)   Resp 16   Ht 5\' 2"  (1.575 m)   Wt 95.3 kg   SpO2 96%   BMI 38.41 kg/m   Physical Exam Vitals and nursing note reviewed.  Constitutional:      Appearance: She is not ill-appearing or diaphoretic.  HENT:     Head: Normocephalic and atraumatic.     Comments: No raccoon's sign or battle's sign. No hemotympanum. No step offs or deformities noted to scalp.  Eyes:     Extraocular Movements: Extraocular movements intact.     Conjunctiva/sclera: Conjunctivae normal.     Pupils: Pupils are equal, round, and reactive to light.  Cardiovascular:     Rate and Rhythm: Normal rate and regular rhythm.     Pulses: Normal pulses.  Pulmonary:     Effort: Pulmonary effort is normal.     Breath sounds: Normal breath sounds. No wheezing, rhonchi or rales.  Skin:    General: Skin is warm and dry.     Coloration: Skin is not jaundiced.  Neurological:     Mental Status: She is alert.     Comments: CN 3-12 grossly intact A&O x4 GCS 15 Sensation and strength intact Gait nonataxic including with tandem walking Coordination with finger-to-nose WNL Neg romberg, neg pronator drift     ED Results / Procedures / Treatments   Labs (all labs ordered are listed, but only abnormal results are displayed) Labs Reviewed - No data to display  EKG None  Radiology No results found.  Procedures Procedures (including critical care time)  Medications Ordered in ED Medications - No data to display  ED Course  I have reviewed the triage vital signs and the nursing notes.  Pertinent labs & imaging results that were available during my care of the patient were reviewed by me and considered in my medical decision making (see chart for details).  54 year old female presenting to the ED for head injury after 70 pound  dog ran into her and knocked heads together 2 hours ago. Headache that is gradually improving. No LOC. Pt is not anticoagulated. She has no focal neuro deficits on exam today. There are no signs of basilar skull fractures and no stepoffs to scalp. Do not feel pt needs imaging at this time. Have encouraged brain rest and Tylenol PRN for pain. Pt has hx of allergy noted to ibuprofen. Strict return precautions have been discussed with pt. She is advised to follow up  with PCP. Pt is in agreement with plan and stable for discharge home.   This note was prepared using Dragon voice recognition software and may include unintentional dictation errors due to the inherent limitations of voice recognition software.     MDM Rules/Calculators/A&P                       Final Clinical Impression(s) / ED Diagnoses Final diagnoses:  Injury of head, initial encounter    Rx / DC Orders ED Discharge Orders    None       Discharge Instructions     Follow up with your PCP regarding your ED visit today. Take Tylenol as needed for your headache.   Return to the ED IMMEDIATELY for any worsening symptoms including severe headache, vision changes, weakness or numbness on one side of your body, passing out, nausea/vomiting, speech changes, confusion, or any other associated symptoms.        Eustaquio Maize, PA-C 08/23/19 1518    Blanchie Dessert, MD 08/25/19 [phone]

## 2019-08-23 NOTE — Discharge Instructions (Addendum)
Follow up with your PCP regarding your ED visit today. Take Tylenol as needed for your headache.   Return to the ED IMMEDIATELY for any worsening symptoms including severe headache, vision changes, weakness or numbness on one side of your body, passing out, nausea/vomiting, speech changes, confusion, or any other associated symptoms.

## 2019-11-08 ENCOUNTER — Other Ambulatory Visit: Payer: Self-pay | Admitting: Obstetrics and Gynecology

## 2019-11-08 DIAGNOSIS — R928 Other abnormal and inconclusive findings on diagnostic imaging of breast: Secondary | ICD-10-CM

## 2019-11-12 ENCOUNTER — Other Ambulatory Visit: Payer: Self-pay | Admitting: Obstetrics and Gynecology

## 2019-11-12 DIAGNOSIS — Z9189 Other specified personal risk factors, not elsewhere classified: Secondary | ICD-10-CM

## 2019-11-17 ENCOUNTER — Ambulatory Visit
Admission: RE | Admit: 2019-11-17 | Discharge: 2019-11-17 | Disposition: A | Discharge status: Home / Self Care | Payer: 59 | Source: Ambulatory Visit | Attending: Obstetrics and Gynecology | Admitting: Obstetrics and Gynecology

## 2019-11-17 ENCOUNTER — Other Ambulatory Visit: Payer: Self-pay

## 2019-11-17 ENCOUNTER — Other Ambulatory Visit: Payer: Self-pay | Admitting: Obstetrics and Gynecology

## 2019-11-17 DIAGNOSIS — R928 Other abnormal and inconclusive findings on diagnostic imaging of breast: Secondary | ICD-10-CM

## 2020-05-23 ENCOUNTER — Other Ambulatory Visit: Payer: 59

## 2021-01-29 ENCOUNTER — Encounter: Payer: Self-pay | Admitting: Gastroenterology

## 2021-03-17 ENCOUNTER — Emergency Department: Admit: 2021-03-17 | Payer: Self-pay

## 2021-03-17 ENCOUNTER — Other Ambulatory Visit: Payer: Self-pay

## 2021-03-17 ENCOUNTER — Emergency Department (INDEPENDENT_AMBULATORY_CARE_PROVIDER_SITE_OTHER)
Admission: EM | Admit: 2021-03-17 | Discharge: 2021-03-17 | Disposition: A | Discharge status: Home / Self Care | Payer: 59 | Source: Home / Self Care

## 2021-03-17 DIAGNOSIS — J0100 Acute maxillary sinusitis, unspecified: Secondary | ICD-10-CM

## 2021-03-17 DIAGNOSIS — J309 Allergic rhinitis, unspecified: Secondary | ICD-10-CM

## 2021-03-17 DIAGNOSIS — R059 Cough, unspecified: Secondary | ICD-10-CM

## 2021-03-17 MED ORDER — CEFDINIR 300 MG PO CAPS: 300.0000 mg | ORAL_CAPSULE | Freq: Two times a day (BID) | ORAL | 0 refills | Status: AC

## 2021-03-17 MED ORDER — PREDNISONE 20 MG PO TABS: ORAL_TABLET | ORAL | 0 refills | Status: DC

## 2021-03-17 MED ORDER — AZELASTINE HCL 0.1 % NA SOLN: 2.0000 | Freq: Two times a day (BID) | NASAL | 0 refills | Status: AC

## 2021-03-17 NOTE — Discharge Instructions (Addendum)
Advised/instructed patient to take medication as directed with food to completion.  Advised patient to take prednisone with first dose of antibiotic for 5 of 7 days.  Advised patient may use Astelin sprays twice daily for the next 5 days for concurrent postnasal drainage/drip then as needed afterwards.  Encouraged patient increase daily water intake while taking these medications.

## 2021-03-17 NOTE — ED Triage Notes (Signed)
3 day h/o productive cough and onset last night of HA. Pt reports "crackling noises" when she breathes deeply. Cough is interfering with her sleep. Has been taking sudafed w/relief. Has been taking tylenol for the HA w/o relief.

## 2021-03-17 NOTE — ED Provider Notes (Signed)
[facility]    CSN: [id number] Arrival date & time: 03/17/21  1316      History   Chief Complaint Chief Complaint  Patient presents with   Cough   Headache    HPI Tanya Lawson is a 55 y.o. female.   HPI 55 year old female presents with productive cough for 3 days and onset of headache last night.  Patient reports crackling noises when she breathes deeply.  Patient reports taking Sudafed with some relief and Tylenol for headache without relief.  Past Medical History:  Diagnosis Date   Allergy    Anxiety    H/O cold sores    Heart murmur    History of kidney stones    Hyperlipidemia    Hypertension    Right ureteral stone    TMJ (temporomandibular joint syndrome)     There are no problems to display for this patient.   Past Surgical History:  Procedure Laterality Date   CESAREAN SECTION  1999  &  09-09-2001   CYSTO/  RIGHT RETROGRADE PYELOGRAM/  RIGHT URETEROSCOPIC LASER LITHOTRIPSY STONE EXTRACTION/  STENT PLACEMENT  05-02-2006   &   09-21-2007   CYSTOSCOPY WITH RETROGRADE PYELOGRAM, URETEROSCOPY AND STENT PLACEMENT Right 11/23/2013   Procedure: CYSTOSCOPY WITH RETROGRADE PYELOGRAM, URETEROSCOPY, STONE MANIPULATION  AND STENT PLACEMENT;  Surgeon: Arvil Persons, MD;  Location: [facility];  Service: Urology;  Laterality: Right;   LAPAROSCOPY WITH LEFT SALPINGECTOMY W/ RESECTION OF ECTOPIC PREGNANCY  04-27-2000   NEGATIVE SLEEP STUDY   2013   TONSILLECTOMY  1985    OB History   No obstetric history on file.      Home Medications    Prior to Admission medications   Medication Sig Start Date End Date Taking? Authorizing Provider  azelastine (ASTELIN) 0.1 % nasal spray Place 2 sprays into both nostrils 2 (two) times daily. Use in each nostril as directed 03/17/21  Yes Eliezer Lofts, FNP  cefdinir (OMNICEF) 300 MG capsule Take 1 capsule (300 mg total) by mouth 2 (two) times daily for 7 days. 03/17/21 03/24/21 Yes Eliezer Lofts,  FNP  predniSONE (DELTASONE) 20 MG tablet Take 4 tabs PO daily x 5 days. 03/17/21  Yes Eliezer Lofts, FNP  atorvastatin (LIPITOR) 20 MG tablet Take 20 mg by mouth daily.    [provider]  EPINEPHrine 0.3 mg/0.3 mL IJ SOAJ injection Inject 0.3 mLs (0.3 mg total) into the muscle once. 07/24/14   Charlann Lange, PA-C  fexofenadine (ALLEGRA) 180 MG tablet Take 180 mg by mouth daily.    [provider]  hydrochlorothiazide (HYDRODIURIL) 25 MG tablet Take 25 mg by mouth every morning.     [provider]  MAXALT 10 MG tablet Take 10 mg by mouth every 2 (two) hours as needed for migraine.     [provider]  metoprolol succinate (TOPROL-XL) 50 MG 24 hr tablet Take 25 mg by mouth daily.  11/09/13   [provider]  sertraline (ZOLOFT) 100 MG tablet Take 100 mg by mouth daily.     [provider]  valACYclovir (VALTREX) 1000 MG tablet Take 500 mg by mouth 2 (two) times daily.     [provider]    Family History Family History  Problem Relation Age of Onset   Heart attack Father    Stroke Father    Dementia Father    Breast cancer Mother    Hypertension Mother    Hyperlipidemia Mother    Colon  polyps Neg Hx    Colon cancer Neg Hx    Esophageal cancer Neg Hx    Stomach cancer Neg Hx    Rectal cancer Neg Hx     Social History Social History   Tobacco Use   Smoking status: Former    Packs/day: 0.50    Years: 8.00    Pack years: 4.00    Types: Cigarettes    Quit date: 11/19/1988    Years since quitting: 32.3   Smokeless tobacco: Never  Vaping Use   Vaping Use: Never used  Substance Use Topics   Alcohol use: Yes    Comment: RARE   Drug use: No     Allergies   Almond meal, Ibuprofen, Neosporin [neomycin-bacitracin zn-polymyx], Other, Penicillins, and Sulfa antibiotics   Review of Systems Review of Systems  Respiratory:  Positive for cough.   Neurological:  Positive for headaches.  All other systems reviewed and  are negative.   Physical Exam Triage Vital Signs ED Triage Vitals  Enc Vitals Group     BP 03/17/21 1333 129/86     Pulse Rate 03/17/21 1333 65     Resp 03/17/21 1333 18     Temp 03/17/21 1333 98.4 F (36.9 C)     Temp Source 03/17/21 1333 Oral     SpO2 03/17/21 1333 96 %     Weight 03/17/21 1329 220 lb 14.4 oz (100.2 kg)     Height --      Head Circumference --      Peak Flow --      Pain Score 03/17/21 1336 4     Pain Loc --      Pain Edu? --      Excl. in [place]? --    No data found.  Updated Vital Signs BP 129/86 (BP Location: Right Arm)   Pulse 65   Temp 98.4 F (36.9 C) (Oral)   Resp 18   Wt 220 lb 14.4 oz (100.2 kg)   SpO2 96%   BMI 40.40 kg/m      Physical Exam Vitals and nursing note reviewed.  Constitutional:      General: She is not in acute distress.    Appearance: Normal appearance. She is obese. She is not ill-appearing.  HENT:     Head: Normocephalic and atraumatic.     Right Ear: Tympanic membrane, ear canal and external ear normal.     Left Ear: Tympanic membrane, ear canal and external ear normal.     Mouth/Throat:     Mouth: Mucous membranes are moist.     Pharynx: Oropharynx is clear.  Eyes:     Extraocular Movements: Extraocular movements intact.     Conjunctiva/sclera: Conjunctivae normal.     Pupils: Pupils are equal, round, and reactive to light.  Cardiovascular:     Rate and Rhythm: Normal rate and regular rhythm.     Pulses: Normal pulses.     Heart sounds: Normal heart sounds.  Pulmonary:     Effort: Pulmonary effort is normal.     Breath sounds: Normal breath sounds.  Musculoskeletal:        General: Normal range of motion.     Cervical back: Normal range of motion and neck supple.  Skin:    General: Skin is warm and dry.  Neurological:     General: No focal deficit present.     Mental Status: She is alert and oriented to person, place, and time.     UC  Treatments / Results  Labs (all labs ordered are listed, but only  abnormal results are displayed) Labs Reviewed - No data to display  EKG   Radiology No results found.  Procedures Procedures (including critical care time)  Medications Ordered in UC Medications - No data to display  Initial Impression / Assessment and Plan / UC Course  I have reviewed the triage vital signs and the nursing notes.  Pertinent labs & imaging results that were available during my care of the patient were reviewed by me and considered in my medical decision making (see chart for details).     MDM: 1.  Subacute maxillary sinusitis-Rx'd cefdinir; 2.  Cough-Rx'd prednisone burst; 3.  Allergic rhinitis-Rx'd Astelin. Advised/instructed patient to take medication as directed with food to completion.  Advised patient to take prednisone with first dose of antibiotic for 5 of 7 days.  Advised patient may use Astelin sprays twice daily for the next 5 days for concurrent postnasal drainage/drip then as needed afterwards.  Encouraged patient increase daily water intake while taking these medications.  Patient discharged home, hemodynamically stable. Final Clinical Impressions(s) / UC Diagnoses   Final diagnoses:  Cough, unspecified type  Subacute maxillary sinusitis  Allergic rhinitis, unspecified seasonality, unspecified trigger     Discharge Instructions      Advised/instructed patient to take medication as directed with food to completion.  Advised patient to take prednisone with first dose of antibiotic for 5 of 7 days.  Advised patient may use Astelin sprays twice daily for the next 5 days for concurrent postnasal drainage/drip then as needed afterwards.  Encouraged patient increase daily water intake while taking these medications.     ED Prescriptions     Medication Sig Dispense Auth. Provider   azelastine (ASTELIN) 0.1 % nasal spray Place 2 sprays into both nostrils 2 (two) times daily. Use in each nostril as directed 30 mL Eliezer Lofts, FNP   cefdinir (OMNICEF)  300 MG capsule Take 1 capsule (300 mg total) by mouth 2 (two) times daily for 7 days. 14 capsule Eliezer Lofts, FNP   predniSONE (DELTASONE) 20 MG tablet Take 4 tabs PO daily x 5 days. 20 tablet Eliezer Lofts, FNP      PDMP not reviewed this encounter.   Eliezer Lofts, [place] 03/17/21 1444

## 2021-03-28 ENCOUNTER — Other Ambulatory Visit: Payer: Self-pay | Admitting: Obstetrics and Gynecology

## 2021-03-28 ENCOUNTER — Encounter: Payer: Self-pay | Admitting: Gastroenterology

## 2021-03-29 ENCOUNTER — Other Ambulatory Visit: Payer: Self-pay | Admitting: Obstetrics and Gynecology

## 2021-03-29 DIAGNOSIS — Z8249 Family history of ischemic heart disease and other diseases of the circulatory system: Secondary | ICD-10-CM

## 2021-04-18 ENCOUNTER — Other Ambulatory Visit: Payer: Self-pay

## 2021-04-18 ENCOUNTER — Ambulatory Visit
Admission: RE | Admit: 2021-04-18 | Discharge: 2021-04-18 | Disposition: A | Discharge status: Home / Self Care | Payer: No Typology Code available for payment source | Source: Ambulatory Visit | Attending: Obstetrics and Gynecology | Admitting: Obstetrics and Gynecology

## 2021-04-18 ENCOUNTER — Other Ambulatory Visit: Payer: Self-pay | Admitting: Obstetrics and Gynecology

## 2021-04-18 DIAGNOSIS — D241 Benign neoplasm of right breast: Secondary | ICD-10-CM

## 2021-04-18 DIAGNOSIS — Z8249 Family history of ischemic heart disease and other diseases of the circulatory system: Secondary | ICD-10-CM

## 2021-04-25 ENCOUNTER — Telehealth: Payer: Self-pay

## 2021-04-25 NOTE — Telephone Encounter (Signed)
NOTES SCANNED TO REFERRAL 

## 2021-05-01 ENCOUNTER — Ambulatory Visit (INDEPENDENT_AMBULATORY_CARE_PROVIDER_SITE_OTHER): Payer: 59 | Admitting: Internal Medicine

## 2021-05-01 ENCOUNTER — Other Ambulatory Visit: Payer: Self-pay

## 2021-05-01 ENCOUNTER — Encounter: Payer: Self-pay | Admitting: Internal Medicine

## 2021-05-01 VITALS — BP 130/82 | HR 51 | Ht 62.0 in | Wt 221.8 lb

## 2021-05-01 DIAGNOSIS — R931 Abnormal findings on diagnostic imaging of heart and coronary circulation: Secondary | ICD-10-CM

## 2021-05-01 NOTE — Patient Instructions (Addendum)
Medication Instructions:  No Changes In Medications at this time.  *If you need a refill on your cardiac medications before your next appointment, please call your pharmacy*  Lab Work: PLEASE RETURN FOR FASTING LIPID PANEL- NO APPOINTMENT NEEDED. LAB HOURS ARE FROM 8am-4pm If you have labs (blood work) drawn today and your tests are completely normal, you will receive your results only by: [place] (if you have MyChart) OR A paper copy in the mail If you have any lab test that is abnormal or we need to change your treatment, we will call you to review the results.  Follow-Up: At [facility], you and your health needs are our priority.  As part of our continuing mission to provide you with exceptional heart care, we have created designated Provider Care Teams.  These Care Teams include your primary Cardiologist (physician) and Advanced Practice Providers (APPs -  Physician Assistants and Nurse Practitioners) who all work together to provide you with the care you need, when you need it.  We recommend signing up for the patient portal called "MyChart".  Sign up information is provided on this After Visit Summary.  MyChart is used to connect with patients for Virtual Visits (Telemedicine).  Patients are able to view lab/test results, encounter notes, upcoming appointments, etc.  Non-urgent messages can be sent to your provider as well.   To learn more about what you can do with MyChart, go to [email or web address].    Your next appointment:   AS NEEDED   The format for your next appointment:   In Person  Provider:   Dr. Harl Bowie

## 2021-05-01 NOTE — Progress Notes (Signed)
Cardiology Office Note:    Date:  05/01/2021   ID:  Calvert Cantor, DOB 12-08-1965, MRN [id number]  PCP:  Crist Infante, MD   [place] Providers Cardiologist:  None     Referring MD: Arvella Nigh, MD   No chief complaint on file. CAC  History of Present Illness:    Tinie Mcgloin is a 55 y.o. female with a hx of HTN, HLD referral for CAC 106   Mrs Libman is not having any symptoms. She typically stays home and takes care of her mother and her animals. She denies dyspnea on exertion. She denies LH, dizzines or syncope. She had no issues with pregnancies. No gestational DM or preeclampsia.   She is managed for hypertension, it is typically 120/80 or so. She is taking HCTz 25 mg and metoprolol 50 mg daily.   For hyperlipidemia she is taking atorvastatin 20mg , she's been on this for years.   Family History: father had MI at 26 years of age. Mother had a few strokes. Sister had an MI at 31.  Social Hx: she smoked when she was young and stopped at 89. 5 pack years.She has two children and they are healthy  CVD Risk/Equivalent: HLD- yes HTN- yes PAD- no DMII- no Smoker-former  Premature Family History- Yes   04/18/2021 FINDINGS: CORONARY CALCIUM SCORES:   Left Main: 0   LAD: 2.4   LCx: 0.7   RCA: 47.4   Total Agatston Score: 50.5   MESA database percentile: 91   AORTA MEASUREMENTS:   Ascending Aorta: 32 mm   Descending Aorta: 25 mm    Past Medical History:  Diagnosis Date   Allergy    Anxiety    H/O cold sores    Heart murmur    History of kidney stones    Hyperlipidemia    Hypertension    Right ureteral stone    TMJ (temporomandibular joint syndrome)     Past Surgical History:  Procedure Laterality Date   CESAREAN SECTION  1999  &  09-09-2001   CYSTO/  RIGHT RETROGRADE PYELOGRAM/  RIGHT URETEROSCOPIC LASER LITHOTRIPSY STONE EXTRACTION/  STENT PLACEMENT  05-02-2006   &   09-21-2007   CYSTOSCOPY WITH RETROGRADE PYELOGRAM,  URETEROSCOPY AND STENT PLACEMENT Right 11/23/2013   Procedure: CYSTOSCOPY WITH RETROGRADE PYELOGRAM, URETEROSCOPY, STONE MANIPULATION  AND STENT PLACEMENT;  Surgeon: Arvil Persons, MD;  Location: [facility];  Service: Urology;  Laterality: Right;   LAPAROSCOPY WITH LEFT SALPINGECTOMY W/ RESECTION OF ECTOPIC PREGNANCY  04-27-2000   NEGATIVE SLEEP STUDY   2013   TONSILLECTOMY  1985    Current Medications: No outpatient medications have been marked as taking for the 05/01/21 encounter (Appointment) with Janina Mayo, MD.     Allergies:   Almond meal, Ibuprofen, Neosporin [neomycin-bacitracin zn-polymyx], Other, Penicillins, and Sulfa antibiotics   Social History   Socioeconomic History   Marital status: Married    Spouse name: Not on file   Number of children: Not on file   Years of education: Not on file   Highest education level: Not on file  Occupational History   Not on file  Tobacco Use   Smoking status: Former    Packs/day: 0.50    Years: 8.00    Pack years: 4.00    Types: Cigarettes    Quit date: 11/19/1988    Years since quitting: 32.4   Smokeless tobacco: Never  Vaping Use   Vaping Use: Never used  Substance and  Sexual Activity   Alcohol use: Yes    Comment: RARE   Drug use: No   Sexual activity: Not on file  Other Topics Concern   Not on file  Social History Narrative   Not on file   Social Determinants of Health   Financial Resource Strain: Not on file  Food Insecurity: Not on file  Transportation Needs: Not on file  Physical Activity: Not on file  Stress: Not on file  Social Connections: Not on file     Family History: The patient's family history includes Breast cancer in her mother; Dementia in her father; Heart attack in her father; Hyperlipidemia in her mother; Hypertension in her mother; Stroke in her father. There is no history of Colon polyps, Colon cancer, Esophageal cancer, Stomach cancer, or Rectal cancer.  ROS:   Please see  the history of present illness.     All other systems reviewed and are negative.  EKGs/Labs/Other Studies Reviewed:    The following studies were reviewed today:   EKG:  EKG is  ordered today.  The ekg ordered today demonstrates   Sinus bradycardia HR 51 bpm  Recent Labs: No results found for requested labs within last 8760 hours.  Recent Lipid Panel No results found for: CHOL, TRIG, HDL, CHOLHDL, VLDL, LDLCALC, LDLDIRECT   Risk Assessment/Calculations:         Physical Exam:    VS:  There were no vitals taken for this visit.    Wt Readings from Last 3 Encounters:  03/17/21 220 lb 14.4 oz (100.2 kg)  08/23/19 210 lb (95.3 kg)  12/30/18 210 lb (95.3 kg)     GEN:  Well nourished, well developed in no acute distress HEENT: Normal NECK: No JVD; No carotid bruits LYMPHATICS: No lymphadenopathy CARDIAC: RRR, no murmurs, rubs, gallops RESPIRATORY:  Clear to auscultation without rales, wheezing or rhonchi  ABDOMEN: Soft, non-tender, non-distended MUSCULOSKELETAL:  No edema; No deformity  SKIN: Warm and dry NEUROLOGIC:  Alert and oriented x 3 PSYCHIATRIC:  Normal affect   ASSESSMENT:    #CAC: She has CAC score 91st percentile for age. She is asymptomatic. Will plan to get lipid levels and uptitrate her statin if needed. LDL goal < 100 mg/dL. Her CAC is less than 300. Otherwise her Bps are well controlled. She does not smoke and has no DMII. She is aware that it is important for her to incorporate lifestyle changes including weight loss and improving diet.   PLAN:    In order of problems listed above:  Lipid Profile Follow up PRN      Medication Adjustments/Labs and Tests Ordered: Current medicines are reviewed at length with the patient today.  Concerns regarding medicines are outlined above.   Signed, Janina Mayo, MD  05/01/2021 1:54 PM    [facility]

## 2021-05-16 ENCOUNTER — Telehealth: Payer: Self-pay | Admitting: *Deleted

## 2021-05-16 ENCOUNTER — Ambulatory Visit (AMBULATORY_SURGERY_CENTER): Payer: 59 | Admitting: *Deleted

## 2021-05-16 VITALS — Ht 63.0 in | Wt 221.0 lb

## 2021-05-16 DIAGNOSIS — Z86010 Personal history of colonic polyps: Secondary | ICD-10-CM

## 2021-05-16 MED ORDER — PEG 3350-KCL-NA BICARB-NACL 420 G PO SOLR: 4000.0000 mL | Freq: Once | ORAL | 0 refills | Status: AC

## 2021-05-16 NOTE — Telephone Encounter (Signed)
Patient was called x2 for phone pre-visit appointment at 11 am. No answer, left message for the patient to call us back before 5 pm today or the colonoscopy would be cancelled.

## 2021-05-16 NOTE — Telephone Encounter (Signed)
Previsit completed with patient via phone.

## 2021-05-16 NOTE — Progress Notes (Signed)
Patient's pre-visit was done today over the phone with the patient. Name,DOB and address verified. Patient denies any allergies to Eggs and Soy. Patient denies any problems with anesthesia/sedation. Patient is not taking any diet pills or blood thinners. No home Oxygen. Packet of Prep instructions mailed to patient including a copy of a consent form-pt is aware. Patient understands to call us back with any questions or concerns. Patient is aware of our care-partner policy and GNFAO-13 safety protocol.  The patient is COVID-19 vaccinated.

## 2021-05-22 ENCOUNTER — Ambulatory Visit
Admission: RE | Admit: 2021-05-22 | Discharge: 2021-05-22 | Disposition: A | Discharge status: Home / Self Care | Payer: 59 | Source: Ambulatory Visit | Attending: Obstetrics and Gynecology | Admitting: Obstetrics and Gynecology

## 2021-05-22 ENCOUNTER — Other Ambulatory Visit: Payer: Self-pay | Admitting: Obstetrics and Gynecology

## 2021-05-22 DIAGNOSIS — D241 Benign neoplasm of right breast: Secondary | ICD-10-CM

## 2021-05-29 ENCOUNTER — Encounter: Payer: Self-pay | Admitting: Gastroenterology

## 2021-05-30 ENCOUNTER — Encounter: Payer: Self-pay | Admitting: Gastroenterology

## 2021-05-30 ENCOUNTER — Ambulatory Visit (AMBULATORY_SURGERY_CENTER): Payer: 59 | Admitting: Gastroenterology

## 2021-05-30 ENCOUNTER — Other Ambulatory Visit: Payer: Self-pay

## 2021-05-30 VITALS — BP 145/75 | HR 58 | Temp 98.1°F | Resp 12 | Ht 63.0 in | Wt 221.0 lb

## 2021-05-30 DIAGNOSIS — Z86010 Personal history of colonic polyps: Secondary | ICD-10-CM | POA: Diagnosis not present

## 2021-05-30 DIAGNOSIS — D122 Benign neoplasm of ascending colon: Secondary | ICD-10-CM

## 2021-05-30 MED ORDER — SODIUM CHLORIDE 0.9 % IV SOLN: 500.0000 mL | Freq: Once | INTRAVENOUS | Status: DC

## 2021-05-30 NOTE — Progress Notes (Signed)
Report to PACU, RN, vss, BBS= Clear.  

## 2021-05-30 NOTE — Progress Notes (Signed)
Called to room to assist during endoscopic procedure.  Patient ID and intended procedure confirmed with present staff. Received instructions for my participation in the procedure from the performing physician.  

## 2021-05-30 NOTE — Op Note (Signed)
[facility] Patient Name: Tanya Lawson Procedure Date: 05/30/2021 10:59 AM MRN: [id number] Endoscopist: Milus Banister , MD Age: 56 Referring MD:  Date of Birth: [birth year] Gender: Female Account #: [id number] Procedure:                Colonoscopy Indications:              High risk colon cancer surveillance: Personal                            history of colonic polyps; Colonoscopy 10/2017 Dr.                            Ardis Hughs found 1.6cm SSA piecemeal resected, tattoo'd                            (ascending colon) and 1.1cm TA (transverse).                            Colonoscopy 12/2018 Dr. Ardis Hughs found no polyps near                            tattoo site. New 49mm transverse colon TA was                            removed. Medicines:                Monitored Anesthesia Care Procedure:                Pre-Anesthesia Assessment:                           - Prior to the procedure, a History and Physical                            was performed, and patient medications and                            allergies were reviewed. The patient's tolerance of                            previous anesthesia was also reviewed. The risks                            and benefits of the procedure and the sedation                            options and risks were discussed with the patient.                            All questions were answered, and informed consent                            was obtained. Prior Anticoagulants: The patient has  taken no previous anticoagulant or antiplatelet                            agents. ASA Grade Assessment: II - A patient with                            mild systemic disease. After reviewing the risks                            and benefits, the patient was deemed in                            satisfactory condition to undergo the procedure.                           After obtaining informed consent, the colonoscope                             was passed under direct vision. Throughout the                            procedure, the patient's blood pressure, pulse, and                            oxygen saturations were monitored continuously. The                            CF [id number] #[id number] was introduced through the anus                            and advanced to the the cecum, identified by                            appendiceal orifice and ileocecal valve. The                            colonoscopy was performed without difficulty. The                            patient tolerated the procedure well. The quality                            of the bowel preparation was good. The ileocecal                            valve, appendiceal orifice, and rectum were                            photographed. Scope In: 11:19:48 AM Scope Out: 11:28:07 AM Scope Withdrawal Time: 0 hours 5 minutes 51 seconds  Total Procedure Duration: 0 hours 8 minutes 19 seconds  Findings:                 The residual submucosal tattoo in ascending colon  was barely perceptable and no recurrent polyp                            tissue was present.                           A 3 mm polyp was found in the ascending colon. The                            polyp was sessile. The polyp was removed with a                            cold snare. Resection and retrieval were complete.                           External hemorrhoids were found. The hemorrhoids                            were small.                           The exam was otherwise without abnormality on                            direct and retroflexion views. Complications:            No immediate complications. Estimated blood loss:                            None. Estimated Blood Loss:     Estimated blood loss: none. Impression:               - One 3 mm polyp in the ascending colon, removed                            with a cold snare. Resected and retrieved.                            - The residual submucosal tattoo in ascending colon                            was barely perceptable and no recurrent polyp                            tissue was present.                           - External hemorrhoids.                           - The examination was otherwise normal on direct                            and retroflexion views. Recommendation:           - Patient has a contact number available for  emergencies. The signs and symptoms of potential                            delayed complications were discussed with the                            patient. Return to normal activities tomorrow.                            Written discharge instructions were provided to the                            patient.                           - Resume previous diet.                           - Continue present medications.                           - Await pathology results. Milus Banister, MD 05/30/2021 11:33:11 AM This report has been signed electronically.

## 2021-05-30 NOTE — Progress Notes (Signed)
Pt's states no medical or surgical changes since previsit or office visit. VS assessed by D.T 

## 2021-05-30 NOTE — Progress Notes (Signed)
Colonoscopy 10/2017 Dr. Ardis Hughs found 1.6cm SSA piecemeal resected, tattoo'd (ascending colon) and 1.1cm TA (transverse). Colonoscopy 12/2018 Dr. Ardis Hughs found no polyps near tattoo site.  New 23mm transverse colon TA was removed.  HPI: This is a woman with h/o polyps   ROS: complete GI ROS as described in HPI, all other review negative.  Constitutional:  No unintentional weight loss   Past Medical History:  Diagnosis Date   Allergy    Anxiety    H/O cold sores    Heart murmur    History of kidney stones    Hyperlipidemia    Hypertension    Right ureteral stone    TMJ (temporomandibular joint syndrome)     Past Surgical History:  Procedure Laterality Date   CESAREAN SECTION  1999  &  09-09-2001   COLONOSCOPY  12/30/2018   Dr.Jacynda Brunke   CYSTO/  RIGHT RETROGRADE PYELOGRAM/  RIGHT URETEROSCOPIC LASER LITHOTRIPSY STONE EXTRACTION/  STENT PLACEMENT  05-02-2006   &   09-21-2007   CYSTOSCOPY WITH RETROGRADE PYELOGRAM, URETEROSCOPY AND STENT PLACEMENT Right 11/23/2013   Procedure: CYSTOSCOPY WITH RETROGRADE PYELOGRAM, URETEROSCOPY, STONE MANIPULATION  AND STENT PLACEMENT;  Surgeon: Arvil Persons, MD;  Location: [facility];  Service: Urology;  Laterality: Right;   LAPAROSCOPY WITH LEFT SALPINGECTOMY W/ RESECTION OF ECTOPIC PREGNANCY  04/27/2000   NEGATIVE SLEEP STUDY   05/28/2011   POLYPECTOMY     TONSILLECTOMY  05/28/1983    Current Outpatient Medications  Medication Sig Dispense Refill   atorvastatin (LIPITOR) 20 MG tablet Take 20 mg by mouth daily.     hydrochlorothiazide (HYDRODIURIL) 25 MG tablet Take 25 mg by mouth every morning.      MAXALT 10 MG tablet Take 10 mg by mouth every 2 (two) hours as needed for migraine.      metoprolol succinate (TOPROL-XL) 50 MG 24 hr tablet Take 25 mg by mouth daily.      sertraline (ZOLOFT) 100 MG tablet Take 100 mg by mouth daily.      valACYclovir (VALTREX) 1000 MG tablet Take 500 mg by mouth 2 (two) times daily.      azelastine  (ASTELIN) 0.1 % nasal spray Place 2 sprays into both nostrils 2 (two) times daily. Use in each nostril as directed (Patient not taking: Reported on 05/30/2021) 30 mL 0   EPINEPHrine 0.3 mg/0.3 mL IJ SOAJ injection Inject 0.3 mLs (0.3 mg total) into the muscle once. (Patient not taking: Reported on 05/16/2021) 1 Device 1   fexofenadine (ALLEGRA) 180 MG tablet Take 180 mg by mouth daily. (Patient not taking: Reported on 05/30/2021)     Current Facility-Administered Medications  Medication Dose Route Frequency Provider Last Rate Last Admin   0.9 %  sodium chloride infusion  500 mL Intravenous Once Milus Banister, MD        Allergies as of 05/30/2021 - Review Complete 05/30/2021  Allergen Reaction Noted   Sulfa antibiotics Hives, Swelling, and Other (See Comments) 02/26/2011   Almond meal Other (See Comments) 11/23/2013   Ibuprofen Swelling 07/16/2015   Neosporin [neomycin-bacitracin zn-polymyx] Hives 11/23/2013   Other Other (See Comments) 11/19/2013   Penicillins Hives and Swelling 02/26/2011    Family History  Problem Relation Age of Onset   Heart attack Father    Stroke Father    Dementia Father    Breast cancer Mother    Hypertension Mother    Hyperlipidemia Mother    Colon polyps Neg Hx    Colon cancer Neg  Hx    Esophageal cancer Neg Hx    Stomach cancer Neg Hx    Rectal cancer Neg Hx     Social History   Socioeconomic History   Marital status: Married    Spouse name: Not on file   Number of children: Not on file   Years of education: Not on file   Highest education level: Not on file  Occupational History   Not on file  Tobacco Use   Smoking status: Former    Packs/day: 0.50    Years: 8.00    Pack years: 4.00    Types: Cigarettes    Quit date: 11/19/1988    Years since quitting: 32.5   Smokeless tobacco: Never  Vaping Use   Vaping Use: Never used  Substance and Sexual Activity   Alcohol use: Yes    Comment: RARE   Drug use: No   Sexual activity: Not on file   Other Topics Concern   Not on file  Social History Narrative   Not on file   Social Determinants of Health   Financial Resource Strain: Not on file  Food Insecurity: Not on file  Transportation Needs: Not on file  Physical Activity: Not on file  Stress: Not on file  Social Connections: Not on file  Intimate Partner Violence: Not on file     Physical Exam: BP 134/74    Pulse 62    Temp 98.1 F (36.7 C) (Skin)    Ht 5\' 3"  (1.6 m)    Wt 221 lb (100.2 kg)    SpO2 96%    BMI 39.15 kg/m  Constitutional: generally well-appearing Psychiatric: alert and oriented x3 Lungs: CTA bilaterally Heart: no MCR  Assessment and plan: 56 y.o. female with h/p polyps  Colonosdcopy today  Care is appropriate for the ambulatory setting.  Owens Loffler, MD [facility] 05/30/2021, 11:01 AM

## 2021-05-30 NOTE — Patient Instructions (Signed)
Thank you for letting us take care of your healthcare needs today. Please see handouts given to you on Polyps and Hemorrhoids.     YOU HAD AN ENDOSCOPIC PROCEDURE TODAY AT THE  ENDOSCOPY CENTER:   Refer to the procedure report that was given to you for any specific questions about what was found during the examination.  If the procedure report does not answer your questions, please call your gastroenterologist to clarify.  If you requested that your care partner not be given the details of your procedure findings, then the procedure report has been included in a sealed envelope for you to review at your convenience later.  YOU SHOULD EXPECT: Some feelings of bloating in the abdomen. Passage of more gas than usual.  Walking can help get rid of the air that was put into your GI tract during the procedure and reduce the bloating. If you had a lower endoscopy (such as a colonoscopy or flexible sigmoidoscopy) you may notice spotting of blood in your stool or on the toilet paper. If you underwent a bowel prep for your procedure, you may not have a normal bowel movement for a few days.  Please Note:  You might notice some irritation and congestion in your nose or some drainage.  This is from the oxygen used during your procedure.  There is no need for concern and it should clear up in a day or so.  SYMPTOMS TO REPORT IMMEDIATELY:  Following lower endoscopy (colonoscopy or flexible sigmoidoscopy):  Excessive amounts of blood in the stool  Significant tenderness or worsening of abdominal pains  Swelling of the abdomen that is new, acute  Fever of 100F or higher  For urgent or emergent issues, a gastroenterologist can be reached at any hour by calling [phone]. Do not use MyChart messaging for urgent concerns.    DIET:  We do recommend a small meal at first, but then you may proceed to your regular diet.  Drink plenty of fluids but you should avoid alcoholic beverages for 24  hours.  ACTIVITY:  You should plan to take it easy for the rest of today and you should NOT DRIVE or use heavy machinery until tomorrow (because of the sedation medicines used during the test).    FOLLOW UP: Our staff will call the number listed on your records 48-72 hours following your procedure to check on you and address any questions or concerns that you may have regarding the information given to you following your procedure. If we do not reach you, we will leave a message.  We will attempt to reach you two times.  During this call, we will ask if you have developed any symptoms of COVID 19. If you develop any symptoms (ie: fever, flu-like symptoms, shortness of breath, cough etc.) before then, please call [phone].  If you test positive for Covid 19 in the 2 weeks post procedure, please call and report this information to us.    If any biopsies were taken you will be contacted by phone or by letter within the next 1-3 weeks.  Please call us at [phone] if you have not heard about the biopsies in 3 weeks.    SIGNATURES/CONFIDENTIALITY: You and/or your care partner have signed paperwork which will be entered into your electronic medical record.  These signatures attest to the fact that that the information above on your After Visit Summary has been reviewed and is understood.  Full responsibility of the confidentiality of this discharge information   lies with you and/or your care-partner.  

## 2021-06-01 ENCOUNTER — Other Ambulatory Visit: Payer: Self-pay | Admitting: Obstetrics and Gynecology

## 2021-06-01 ENCOUNTER — Ambulatory Visit
Admission: RE | Admit: 2021-06-01 | Discharge: 2021-06-01 | Disposition: A | Discharge status: Home / Self Care | Payer: 59 | Source: Ambulatory Visit | Attending: Obstetrics and Gynecology | Admitting: Obstetrics and Gynecology

## 2021-06-01 ENCOUNTER — Telehealth: Payer: Self-pay

## 2021-06-01 DIAGNOSIS — D241 Benign neoplasm of right breast: Secondary | ICD-10-CM

## 2021-06-01 DIAGNOSIS — N6310 Unspecified lump in the right breast, unspecified quadrant: Secondary | ICD-10-CM

## 2021-06-01 NOTE — Telephone Encounter (Signed)
°  Follow up Call-  Call back number 05/30/2021 12/30/2018  Post procedure Call Back phone  # [phone]  Permission to leave phone message Yes Yes  Some recent data might be hidden     Patient questions:  Do you have a fever, pain , or abdominal swelling? No. Pain Score  0 *  Have you tolerated food without any problems? Yes.    Have you been able to return to your normal activities? Yes.    Do you have any questions about your discharge instructions: Diet   No. Medications  No. Follow up visit  No.  Do you have questions or concerns about your Care? No.  Actions: * If pain score is 4 or above: No action needed, pain <4.

## 2021-06-04 ENCOUNTER — Encounter: Payer: Self-pay | Admitting: Gastroenterology

## 2021-06-07 ENCOUNTER — Other Ambulatory Visit: Payer: Self-pay | Admitting: Obstetrics and Gynecology

## 2021-06-07 DIAGNOSIS — Z803 Family history of malignant neoplasm of breast: Secondary | ICD-10-CM

## 2021-08-28 ENCOUNTER — Ambulatory Visit
Admission: RE | Admit: 2021-08-28 | Discharge: 2021-08-28 | Disposition: A | Discharge status: Home / Self Care | Payer: 59 | Source: Ambulatory Visit | Attending: Obstetrics and Gynecology | Admitting: Obstetrics and Gynecology

## 2021-08-28 DIAGNOSIS — Z803 Family history of malignant neoplasm of breast: Secondary | ICD-10-CM

## 2021-08-28 MED ORDER — GADOBUTROL 1 MMOL/ML IV SOLN
10.0000 mL | Freq: Once | INTRAVENOUS | Status: AC | PRN
  Administered 2021-08-28: 10 mL via INTRAVENOUS

## 2021-08-29 ENCOUNTER — Other Ambulatory Visit: Payer: Self-pay | Admitting: Obstetrics and Gynecology

## 2021-08-29 DIAGNOSIS — R9389 Abnormal findings on diagnostic imaging of other specified body structures: Secondary | ICD-10-CM

## 2021-08-31 ENCOUNTER — Other Ambulatory Visit: Payer: Self-pay | Admitting: Obstetrics and Gynecology

## 2021-08-31 DIAGNOSIS — R9389 Abnormal findings on diagnostic imaging of other specified body structures: Secondary | ICD-10-CM

## 2021-09-17 ENCOUNTER — Ambulatory Visit
Admission: RE | Admit: 2021-09-17 | Discharge: 2021-09-17 | Disposition: A | Discharge status: Home / Self Care | Payer: 59 | Source: Ambulatory Visit | Attending: Obstetrics and Gynecology | Admitting: Obstetrics and Gynecology

## 2021-09-17 ENCOUNTER — Other Ambulatory Visit (HOSPITAL_COMMUNITY): Payer: Self-pay | Admitting: Diagnostic Radiology

## 2021-09-17 DIAGNOSIS — R9389 Abnormal findings on diagnostic imaging of other specified body structures: Secondary | ICD-10-CM

## 2021-12-20 ENCOUNTER — Ambulatory Visit
Admission: RE | Admit: 2021-12-20 | Discharge: 2021-12-20 | Disposition: A | Discharge status: Home / Self Care | Payer: 59 | Source: Ambulatory Visit | Attending: Obstetrics and Gynecology | Admitting: Obstetrics and Gynecology

## 2021-12-20 ENCOUNTER — Other Ambulatory Visit: Payer: Self-pay | Admitting: Obstetrics and Gynecology

## 2021-12-20 DIAGNOSIS — D241 Benign neoplasm of right breast: Secondary | ICD-10-CM

## 2021-12-20 DIAGNOSIS — N6489 Other specified disorders of breast: Secondary | ICD-10-CM

## 2022-05-24 ENCOUNTER — Ambulatory Visit
Admission: RE | Admit: 2022-05-24 | Discharge: 2022-05-24 | Disposition: A | Discharge status: Home / Self Care | Payer: 59 | Source: Ambulatory Visit | Attending: Obstetrics and Gynecology | Admitting: Obstetrics and Gynecology

## 2022-05-24 DIAGNOSIS — N6489 Other specified disorders of breast: Secondary | ICD-10-CM

## 2023-01-30 ENCOUNTER — Emergency Department (HOSPITAL_BASED_OUTPATIENT_CLINIC_OR_DEPARTMENT_OTHER): Payer: 59

## 2023-01-30 ENCOUNTER — Encounter (HOSPITAL_BASED_OUTPATIENT_CLINIC_OR_DEPARTMENT_OTHER): Payer: Self-pay

## 2023-01-30 ENCOUNTER — Other Ambulatory Visit: Payer: Self-pay

## 2023-01-30 ENCOUNTER — Emergency Department (HOSPITAL_BASED_OUTPATIENT_CLINIC_OR_DEPARTMENT_OTHER)
Admission: EM | Admit: 2023-01-30 | Discharge: 2023-01-31 | Disposition: A | Discharge status: Home / Self Care | Payer: 59 | Attending: Emergency Medicine | Admitting: Emergency Medicine

## 2023-01-30 DIAGNOSIS — N132 Hydronephrosis with renal and ureteral calculous obstruction: Secondary | ICD-10-CM | POA: Insufficient documentation

## 2023-01-30 DIAGNOSIS — R112 Nausea with vomiting, unspecified: Secondary | ICD-10-CM | POA: Diagnosis present

## 2023-01-30 DIAGNOSIS — U071 COVID-19: Secondary | ICD-10-CM | POA: Insufficient documentation

## 2023-01-30 DIAGNOSIS — N201 Calculus of ureter: Secondary | ICD-10-CM

## 2023-01-30 DIAGNOSIS — I10 Essential (primary) hypertension: Secondary | ICD-10-CM | POA: Insufficient documentation

## 2023-01-30 DIAGNOSIS — Z79899 Other long term (current) drug therapy: Secondary | ICD-10-CM | POA: Insufficient documentation

## 2023-01-30 DIAGNOSIS — N1330 Unspecified hydronephrosis: Secondary | ICD-10-CM

## 2023-01-30 LAB — URINALYSIS, ROUTINE W REFLEX MICROSCOPIC
Bacteria, UA: NONE SEEN
Bilirubin Urine: NEGATIVE
Glucose, UA: NEGATIVE mg/dL
Ketones, ur: NEGATIVE mg/dL
Leukocytes,Ua: NEGATIVE
Nitrite: NEGATIVE
Protein, ur: NEGATIVE mg/dL
Specific Gravity, Urine: 1.014 (ref 1.005–1.030)
pH: 6 (ref 5.0–8.0)

## 2023-01-30 LAB — BASIC METABOLIC PANEL
Anion gap: 9 (ref 5–15)
BUN: 11 mg/dL (ref 6–20)
CO2: 27 mmol/L (ref 22–32)
Calcium: 9.3 mg/dL (ref 8.9–10.3)
Chloride: 99 mmol/L (ref 98–111)
Creatinine, Ser: 0.67 mg/dL (ref 0.44–1.00)
GFR, Estimated: >60 mL/min (ref >60)
Glucose, Bld: 114 mg/dL — ABNORMAL HIGH (ref 70–99)
Potassium: 3.5 mmol/L (ref 3.5–5.1)
Sodium: 135 mmol/L (ref 135–145)

## 2023-01-30 LAB — CBC
HCT: 38.2 % (ref 36.0–46.0)
Hemoglobin: 13.4 g/dL (ref 12.0–15.0)
MCH: 30.5 pg (ref 26.0–34.0)
MCHC: 35.1 g/dL (ref 30.0–36.0)
MCV: 86.8 fL (ref 80.0–100.0)
Platelets: 236 10*3/uL (ref 150–400)
RBC: 4.4 MIL/uL (ref 3.87–5.11)
RDW: 12.2 % (ref 11.5–15.5)
WBC: 9.7 10*3/uL (ref 4.0–10.5)
nRBC: 0 % (ref 0.0–0.2)

## 2023-01-30 LAB — PREGNANCY, URINE: Preg Test, Ur: NEGATIVE

## 2023-01-30 LAB — SARS CORONAVIRUS 2 BY RT PCR: SARS Coronavirus 2 by RT PCR: POSITIVE — AB

## 2023-01-30 MED ORDER — LACTATED RINGERS IV BOLUS
1000.0000 mL | Freq: Once | INTRAVENOUS | Status: AC
  Administered 2023-01-30: 1000 mL via INTRAVENOUS

## 2023-01-30 MED ORDER — TAMSULOSIN HCL 0.4 MG PO CAPS
0.4000 mg | ORAL_CAPSULE | Freq: Once | ORAL | Status: AC
  Administered 2023-01-30: 0.4 mg via ORAL
  Filled 2023-01-30: qty 1

## 2023-01-30 MED ORDER — ONDANSETRON HCL 4 MG PO TABS: 4.0000 mg | ORAL_TABLET | Freq: Three times a day (TID) | ORAL | 0 refills | Status: AC | PRN

## 2023-01-30 MED ORDER — OXYCODONE HCL 5 MG PO TABS: 5.0000 mg | ORAL_TABLET | Freq: Four times a day (QID) | ORAL | 0 refills | Status: AC | PRN

## 2023-01-30 MED ORDER — MORPHINE SULFATE (PF) 4 MG/ML IV SOLN
4.0000 mg | Freq: Once | INTRAVENOUS | Status: AC
  Administered 2023-01-30: 4 mg via INTRAVENOUS
  Filled 2023-01-30: qty 1

## 2023-01-30 MED ORDER — TAMSULOSIN HCL 0.4 MG PO CAPS: 0.4000 mg | ORAL_CAPSULE | Freq: Every day | ORAL | 0 refills | Status: AC

## 2023-01-30 MED ORDER — ONDANSETRON HCL 4 MG/2ML IJ SOLN
4.0000 mg | Freq: Once | INTRAMUSCULAR | Status: AC
  Administered 2023-01-30: 4 mg via INTRAVENOUS
  Filled 2023-01-30: qty 2

## 2023-01-30 NOTE — ED Triage Notes (Signed)
POV from home, A&O x 4, GCS 15, amb to room  Right sided flank pain, frequency with urination that began this am, hx of stones. Congestion (has allergies) but husband and daughter both have covid, she's been testing neg at home.

## 2023-01-30 NOTE — Discharge Instructions (Addendum)
Thank you for letting us take care of you today.   You do have a kidney stone which is passing but your urine does not appear infected, your kidney function is normal, and you are not retaining urine.  With this, we can send you home with pain and nausea control as well as Flomax to take nightly to help you pass the stone.  Please be sure to stay really well-hydrated as well.  I recommend following up with urology for further evaluation of why you continue to develop so many stones or for any needed procedures should she not passed the stone.  You are also COVID-positive.  COVID is a virus which does not respond to antibiotics.  We treat this symptomatically.  If you develop any difficulty breathing, please be reevaluated.  For any new concerns or worsening condition including inability to urinate, fever, uncontrollable vomiting or pain, return to the nearest ED for reevaluation.

## 2023-01-30 NOTE — ED Provider Notes (Signed)
EMERGENCY DEPARTMENT AT [facility] Provider Note   CSN: [id number] Arrival date & time: 01/30/23  2033     History  Chief Complaint  Patient presents with   Flank Pain    Tanya Lawson is a 57 y.o. female with past medical history hypertension, hyperlipidemia, multiple previous kidney stones who presents to the ED complaining of sudden onset right flank pain this afternoon with nausea and vomiting.  No hematemesis.  1 small episode of diarrhea but does not believe this is related.  No fever, dysuria, gross hematuria.  Does have urinary hesitancy.  Symptoms feel similar to previous kidney stones.  Has had to have stones removed before.  No other previous abdominal surgeries.       Home Medications Prior to Admission medications   Medication Sig Start Date End Date Taking? Authorizing Provider  ondansetron (ZOFRAN) 4 MG tablet Take 1 tablet (4 mg total) by mouth every 8 (eight) hours as needed for up to 5 days for nausea or vomiting. 01/30/23 02/04/23 Yes Mckenzee Beem L, PA-C  oxyCODONE (ROXICODONE) 5 MG immediate release tablet Take 1 tablet (5 mg total) by mouth every 6 (six) hours as needed for up to 5 days for severe pain or breakthrough pain. 01/30/23 02/04/23 Yes Irianna Gilday L, PA-C  tamsulosin (FLOMAX) 0.4 MG CAPS capsule Take 1 capsule (0.4 mg total) by mouth daily after supper for 14 days. 01/30/23 02/13/23 Yes Augustine Leverette L, PA-C  atorvastatin (LIPITOR) 20 MG tablet Take 20 mg by mouth daily.    [provider]  azelastine (ASTELIN) 0.1 % nasal spray Place 2 sprays into both nostrils 2 (two) times daily. Use in each nostril as directed Patient not taking: Reported on 05/30/2021 03/17/21   Trevor Iha, FNP  EPINEPHrine 0.3 mg/0.3 mL IJ SOAJ injection Inject 0.3 mLs (0.3 mg total) into the muscle once. Patient not taking: Reported on 05/16/2021 07/24/14   Elpidio Anis, PA-C  fexofenadine (ALLEGRA) 180 MG tablet Take 180 mg by mouth  daily. Patient not taking: Reported on 05/30/2021    [provider]  hydrochlorothiazide (HYDRODIURIL) 25 MG tablet Take 25 mg by mouth every morning.     [provider]  MAXALT 10 MG tablet Take 10 mg by mouth every 2 (two) hours as needed for migraine.     [provider]  metoprolol succinate (TOPROL-XL) 50 MG 24 hr tablet Take 25 mg by mouth daily.  11/09/13   [provider]  sertraline (ZOLOFT) 100 MG tablet Take 100 mg by mouth daily.     [provider]  valACYclovir (VALTREX) 1000 MG tablet Take 500 mg by mouth 2 (two) times daily.     [provider]      Allergies    Sulfa antibiotics, Almond mea, Ibuprofen, Neosporin [neomycin-bacitracin zn-polymyx], Other, and Penicillins    Review of Systems   Review of Systems  All other systems reviewed and are negative.   Physical Exam Updated Vital Signs BP (!) 143/69   Pulse (!) 57   Temp 98.6 F (37 C)   Resp 18   Ht 5\' 2"  (1.575 m)   Wt 102.1 kg   SpO2 95%   BMI 41.15 kg/m  Physical Exam Vitals and nursing note reviewed.  Constitutional:      General: She is not in acute distress.    Appearance: Normal appearance. She is not ill-appearing or toxic-appearing.  HENT:     Head: Normocephalic and atraumatic.  Mouth/Throat:     Mouth: Mucous membranes are moist.  Eyes:     Conjunctiva/sclera: Conjunctivae normal.  Cardiovascular:     Rate and Rhythm: Normal rate and regular rhythm.     Heart sounds: No murmur heard. Pulmonary:     Effort: Pulmonary effort is normal.     Breath sounds: Normal breath sounds.  Abdominal:     General: Abdomen is flat. There is no distension.     Palpations: Abdomen is soft.     Tenderness: There is abdominal tenderness (RLQ). There is right CVA tenderness. There is no left CVA tenderness, guarding or rebound.  Musculoskeletal:        General: Normal range of motion.     Cervical back: Neck supple.     Right lower leg: No edema.      Left lower leg: No edema.  Skin:    General: Skin is warm and dry.     Capillary Refill: Capillary refill takes less than 2 seconds.  Neurological:     Mental Status: She is alert. Mental status is at baseline.  Psychiatric:        Behavior: Behavior normal.     ED Results / Procedures / Treatments   Labs (all labs ordered are listed, but only abnormal results are displayed) Labs Reviewed  SARS CORONAVIRUS 2 BY RT PCR - Abnormal; Notable for the following components:      Result Value   SARS Coronavirus 2 by RT PCR POSITIVE (*)    All other components within normal limits  URINALYSIS, ROUTINE W REFLEX MICROSCOPIC - Abnormal; Notable for the following components:   Hgb urine dipstick SMALL (*)    All other components within normal limits  BASIC METABOLIC PANEL - Abnormal; Notable for the following components:   Glucose, Bld 114 (*)    All other components within normal limits  PREGNANCY, URINE  CBC    EKG None  Radiology CT Renal Stone Study  Result Date: 01/30/2023 CLINICAL DATA:  Abdominal/flank pain, stone suspected EXAM: CT ABDOMEN AND PELVIS WITHOUT CONTRAST TECHNIQUE: Multidetector CT imaging of the abdomen and pelvis was performed following the standard protocol without IV contrast. RADIATION DOSE REDUCTION: This exam was performed according to the departmental dose-optimization program which includes automated exposure control, adjustment of the mA and/or kV according to patient size and/or use of iterative reconstruction technique. COMPARISON:  CT 03/14/2020 FINDINGS: Lower chest: Clear lung bases. Hepatobiliary: Mild hepatic steatosis. The liver is enlarged, 22.8 cm cranial caudal. Subcentimeter hypodensity in the anterior liver, too small to characterize. Gallbladder physiologically distended, no calcified stone. No biliary dilatation. Pancreas: No ductal dilatation or inflammation. Spleen: Normal in size without focal abnormality. Adrenals/Urinary Tract: Normal adrenal  glands. Obstructing 4 x 5 mm stone in the distal right ureter with moderate proximal hydroureteronephrosis. Mild right perinephric edema. There are multiple bilateral intrarenal calculi. No left hydronephrosis. Partially distended urinary bladder, no bladder stone. Stomach/Bowel: Stomach is within normal limits. Appendix appears normal. No evidence of bowel wall thickening, distention, or inflammatory changes. Small volume of stool in the colon. Vascular/Lymphatic: Moderate aortic atherosclerosis. No aneurysm. No abdominopelvic adenopathy. Reproductive: Uterus and bilateral adnexa are unremarkable. Other: No ascites. No free air. Small fat containing umbilical hernia. Musculoskeletal: Mild lower lumbar facet hypertrophy. There are no acute or suspicious osseous abnormalities. IMPRESSION: 1. Obstructing 4 x 5 mm stone in the distal right ureter with moderate proximal hydroureteronephrosis. 2. Multiple bilateral intrarenal calculi. 3. Hepatomegaly and hepatic steatosis. Aortic Atherosclerosis (ICD10-I70.0). Electronically  Signed   By: Narda Rutherford M.D.   On: 01/30/2023 22:21    Procedures Procedures    Medications Ordered in ED Medications  morphine (PF) 4 MG/ML injection 4 mg (4 mg Intravenous Given 01/30/23 2149)  ondansetron (ZOFRAN) injection 4 mg (4 mg Intravenous Given 01/30/23 2149)  lactated ringers bolus 1,000 mL (1,000 mLs Intravenous New Bag/Given 01/30/23 2151)  tamsulosin (FLOMAX) capsule 0.4 mg (0.4 mg Oral Given 01/30/23 2321)    ED Course/ Medical Decision Making/ A&P                                 Medical Decision Making Amount and/or Complexity of Data Reviewed Labs: ordered. Decision-making details documented in ED Course. Radiology: ordered. Decision-making details documented in ED Course.  Risk Prescription drug management.   Medical Decision Making:   SHEREDA BUTLAND is a 57 y.o. female who presented to the ED today with abdominal pain detailed above.    Patient's  presentation is complicated by their history of recurrent kidney stones.  Complete initial physical exam performed, notably the patient  was nontoxic-appearing.  She has right lower quadrant and right CVA tenderness.  Abdomen nondistended.    Reviewed and confirmed nursing documentation for past medical history, family history, social history.    Initial Assessment:   With the patient's presentation of abdominal pain, differential diagnosis includes but is not limited to AAA, mesenteric ischemia, appendicitis, diverticulitis, DKA, gastritis, gastroenteritis, AMI, nephrolithiasis, pancreatitis, peritonitis, adrenal insufficiency, intestinal ischemia, constipation, UTI, SBO/LBO, splenic rupture, biliary disease, IBD, IBS, PUD, hepatitis, STD, ovarian/testicular torsion, electrolyte disturbance, DKA, dehydration, acute kidney injury, renal failure, cholecystitis, cholelithiasis, choledocholithiasis, abdominal pain of  unknown etiology.   Initial Plan:  Screening labs including CBC and Metabolic panel to evaluate for infectious or metabolic etiology of disease.  Urinalysis with reflex culture ordered to evaluate for UTI or relevant urologic/nephrologic pathology.  CT abd/pelvis to evaluate for intra-abdominal pathology COVID swab, has been positive, patient with mild cough but no chest pain or shortness of breath Symptomatic management Objective evaluation as reviewed   Initial Study Results:   Laboratory  All laboratory results reviewed without evidence of clinically relevant pathology.   Exceptions include: COVID-positive, urine with small amounts of hemoglobin and 6-10 squamous epithelial cells  Radiology:  All images reviewed independently. Agree with radiology report at this time.   CT Renal Stone Study  Result Date: 01/30/2023 CLINICAL DATA:  Abdominal/flank pain, stone suspected EXAM: CT ABDOMEN AND PELVIS WITHOUT CONTRAST TECHNIQUE: Multidetector CT imaging of the abdomen and pelvis was  performed following the standard protocol without IV contrast. RADIATION DOSE REDUCTION: This exam was performed according to the departmental dose-optimization program which includes automated exposure control, adjustment of the mA and/or kV according to patient size and/or use of iterative reconstruction technique. COMPARISON:  CT 03/14/2020 FINDINGS: Lower chest: Clear lung bases. Hepatobiliary: Mild hepatic steatosis. The liver is enlarged, 22.8 cm cranial caudal. Subcentimeter hypodensity in the anterior liver, too small to characterize. Gallbladder physiologically distended, no calcified stone. No biliary dilatation. Pancreas: No ductal dilatation or inflammation. Spleen: Normal in size without focal abnormality. Adrenals/Urinary Tract: Normal adrenal glands. Obstructing 4 x 5 mm stone in the distal right ureter with moderate proximal hydroureteronephrosis. Mild right perinephric edema. There are multiple bilateral intrarenal calculi. No left hydronephrosis. Partially distended urinary bladder, no bladder stone. Stomach/Bowel: Stomach is within normal limits. Appendix appears normal. No evidence of bowel wall  thickening, distention, or inflammatory changes. Small volume of stool in the colon. Vascular/Lymphatic: Moderate aortic atherosclerosis. No aneurysm. No abdominopelvic adenopathy. Reproductive: Uterus and bilateral adnexa are unremarkable. Other: No ascites. No free air. Small fat containing umbilical hernia. Musculoskeletal: Mild lower lumbar facet hypertrophy. There are no acute or suspicious osseous abnormalities. IMPRESSION: 1. Obstructing 4 x 5 mm stone in the distal right ureter with moderate proximal hydroureteronephrosis. 2. Multiple bilateral intrarenal calculi. 3. Hepatomegaly and hepatic steatosis. Aortic Atherosclerosis (ICD10-I70.0). Electronically Signed   By: Narda Rutherford M.D.   On: 01/30/2023 22:21      Final Assessment and Plan:   57 year old female presents to the ED with acute  onset of right flank pain this afternoon.  She does appear uncomfortable but nontoxic.  Afebrile.  Lower quadrant and right CVA tenderness.  Negative Murphy sign.  No McBurney's point tenderness.  Workup initiated as above for further assessment.  Found to have a distal right ureteral stone.  Postvoid residual negative for urinary retention.  UA does not appear acutely infected.  Normal kidney function.  Patient with good pain and emesis control on reassessment.  CT does confirm a distal ureteral stone.  With otherwise reassuring workup and good pain control, do believe the patient is stable for discharge home.  Will provide with pain control at home as well as Zofran for nausea, Flomax for outflow obstruction.  Discussed importance of close urology follow-up and patient agreeable.  Given very strict ED return precautions, all questions answered, and stable for discharge.   Clinical Impression:  1. Right ureteral stone   2. Hydroureteronephrosis   3. COVID-19      Discharge           Final Clinical Impression(s) / ED Diagnoses Final diagnoses:  Right ureteral stone  Hydroureteronephrosis  COVID-19    Rx / DC Orders ED Discharge Orders          Ordered    oxyCODONE (ROXICODONE) 5 MG immediate release tablet  Every 6 hours PRN        01/30/23 2320    tamsulosin (FLOMAX) 0.4 MG CAPS capsule  Daily after supper        01/30/23 2320    ondansetron (ZOFRAN) 4 MG tablet  Every 8 hours PRN        01/30/23 2320              Tonette Lederer, PA-C 01/30/23 2326    Gwyneth Sprout, MD 01/31/23 2332

## 2023-01-31 NOTE — ED Notes (Signed)
Pt verbalized understanding of d/c instructions, meds, and followup care. Denies questions. VSS, no distress noted. Steady gait to exit with all belongings.  ?

## 2023-04-09 ENCOUNTER — Other Ambulatory Visit: Payer: Self-pay | Admitting: Obstetrics and Gynecology

## 2023-04-09 DIAGNOSIS — N6489 Other specified disorders of breast: Secondary | ICD-10-CM

## 2023-05-26 ENCOUNTER — Ambulatory Visit
Admission: RE | Admit: 2023-05-26 | Discharge: 2023-05-26 | Disposition: A | Discharge status: Home / Self Care | Payer: 59 | Source: Ambulatory Visit | Attending: Obstetrics and Gynecology | Admitting: Obstetrics and Gynecology

## 2023-05-26 DIAGNOSIS — N6489 Other specified disorders of breast: Secondary | ICD-10-CM

## 2023-05-28 ENCOUNTER — Encounter (HOSPITAL_BASED_OUTPATIENT_CLINIC_OR_DEPARTMENT_OTHER): Payer: Self-pay | Admitting: Emergency Medicine

## 2023-05-28 ENCOUNTER — Emergency Department (HOSPITAL_BASED_OUTPATIENT_CLINIC_OR_DEPARTMENT_OTHER)
Admission: EM | Admit: 2023-05-28 | Discharge: 2023-05-28 | Disposition: A | Discharge status: Home / Self Care | Payer: 59 | Attending: Emergency Medicine | Admitting: Emergency Medicine

## 2023-05-28 ENCOUNTER — Emergency Department (HOSPITAL_BASED_OUTPATIENT_CLINIC_OR_DEPARTMENT_OTHER): Payer: 59

## 2023-05-28 ENCOUNTER — Other Ambulatory Visit: Payer: Self-pay

## 2023-05-28 DIAGNOSIS — I10 Essential (primary) hypertension: Secondary | ICD-10-CM | POA: Insufficient documentation

## 2023-05-28 DIAGNOSIS — N202 Calculus of kidney with calculus of ureter: Secondary | ICD-10-CM | POA: Diagnosis not present

## 2023-05-28 DIAGNOSIS — N200 Calculus of kidney: Secondary | ICD-10-CM

## 2023-05-28 DIAGNOSIS — Z79899 Other long term (current) drug therapy: Secondary | ICD-10-CM | POA: Diagnosis not present

## 2023-05-28 DIAGNOSIS — R109 Unspecified abdominal pain: Secondary | ICD-10-CM | POA: Diagnosis present

## 2023-05-28 LAB — URINALYSIS, W/ REFLEX TO CULTURE (INFECTION SUSPECTED)
Bacteria, UA: NONE SEEN
Bilirubin Urine: NEGATIVE
Glucose, UA: NEGATIVE mg/dL
Ketones, ur: NEGATIVE mg/dL
Leukocytes,Ua: NEGATIVE
Nitrite: NEGATIVE
Specific Gravity, Urine: 1.023 (ref 1.005–1.030)
pH: 5.5 (ref 5.0–8.0)

## 2023-05-28 LAB — CBC WITH DIFFERENTIAL/PLATELET
Abs Immature Granulocytes: 0.02 10*3/uL (ref 0.00–0.07)
Basophils Absolute: 0.1 10*3/uL (ref 0.0–0.1)
Basophils Relative: 1 %
Eosinophils Absolute: 0.4 10*3/uL (ref 0.0–0.5)
Eosinophils Relative: 5 %
HCT: 37.8 % (ref 36.0–46.0)
Hemoglobin: 13.5 g/dL (ref 12.0–15.0)
Immature Granulocytes: 0 %
Lymphocytes Relative: 28 %
Lymphs Abs: 2.5 10*3/uL (ref 0.7–4.0)
MCH: 30.9 pg (ref 26.0–34.0)
MCHC: 35.7 g/dL (ref 30.0–36.0)
MCV: 86.5 fL (ref 80.0–100.0)
Monocytes Absolute: 0.6 10*3/uL (ref 0.1–1.0)
Monocytes Relative: 7 %
Neutro Abs: 5.1 10*3/uL (ref 1.7–7.7)
Neutrophils Relative %: 59 %
Platelets: 220 10*3/uL (ref 150–400)
RBC: 4.37 MIL/uL (ref 3.87–5.11)
RDW: 12.2 % (ref 11.5–15.5)
WBC: 8.7 10*3/uL (ref 4.0–10.5)
nRBC: 0 % (ref 0.0–0.2)

## 2023-05-28 LAB — BASIC METABOLIC PANEL
Anion gap: 11 (ref 5–15)
BUN: 21 mg/dL — ABNORMAL HIGH (ref 6–20)
CO2: 26 mmol/L (ref 22–32)
Calcium: 9.7 mg/dL (ref 8.9–10.3)
Chloride: 103 mmol/L (ref 98–111)
Creatinine, Ser: 0.68 mg/dL (ref 0.44–1.00)
GFR, Estimated: >60 mL/min (ref >60)
Glucose, Bld: 146 mg/dL — ABNORMAL HIGH (ref 70–99)
Potassium: 3.2 mmol/L — ABNORMAL LOW (ref 3.5–5.1)
Sodium: 140 mmol/L (ref 135–145)

## 2023-05-28 MED ORDER — OXYCODONE-ACETAMINOPHEN 5-325 MG PO TABS: 1.0000 | ORAL_TABLET | Freq: Three times a day (TID) | ORAL | 0 refills | Status: AC | PRN

## 2023-05-28 MED ORDER — TAMSULOSIN HCL 0.4 MG PO CAPS: 0.4000 mg | ORAL_CAPSULE | Freq: Every day | ORAL | 0 refills | Status: AC

## 2023-05-28 MED ORDER — ONDANSETRON 4 MG PO TBDP: 4.0000 mg | ORAL_TABLET | Freq: Three times a day (TID) | ORAL | 0 refills | Status: AC | PRN

## 2023-05-28 MED ORDER — HYDROMORPHONE HCL 1 MG/ML IJ SOLN
0.5000 mg | Freq: Once | INTRAMUSCULAR | Status: AC
  Administered 2023-05-28: 0.5 mg via INTRAVENOUS
  Filled 2023-05-28: qty 1

## 2023-05-28 MED ORDER — ONDANSETRON HCL 4 MG/2ML IJ SOLN
4.0000 mg | Freq: Once | INTRAMUSCULAR | Status: AC
  Administered 2023-05-28: 4 mg via INTRAVENOUS
  Filled 2023-05-28: qty 2

## 2023-05-28 MED ORDER — KETOROLAC TROMETHAMINE 15 MG/ML IJ SOLN
15.0000 mg | Freq: Once | INTRAMUSCULAR | Status: AC
  Administered 2023-05-28: 15 mg via INTRAVENOUS
  Filled 2023-05-28: qty 1

## 2023-05-28 NOTE — ED Triage Notes (Signed)
 Left flank pain since 0630, last void 0700, feels pressure to void but cannot. Has hx kidney stones.

## 2023-05-28 NOTE — ED Notes (Signed)
 ED Provider at bedside.

## 2023-05-28 NOTE — ED Notes (Signed)
 Pt aware of need for urine sample. Unable to provide one at this time but encouraged to press call bell when able to.

## 2023-05-28 NOTE — ED Provider Notes (Signed)
 [place] EMERGENCY DEPARTMENT AT [facility] Provider Note   CSN: [id number] Arrival date & time: 05/28/23  9093     History  Chief Complaint  Patient presents with   Flank Pain    Tanya Lawson is a 58 y.o. female.   Flank Pain  Patient presents with left-sided flank pain.  Began around 630 this morning.  History of kidney stones.  States she has a necklace made of multiple stones that she is passed before.  No fevers.  States some urinary pressure.  States she did have some chills with the pain and some nausea.    Past Medical History:  Diagnosis Date   Allergy    Anxiety    H/O cold sores    Heart murmur    History of kidney stones    Hyperlipidemia    Hypertension    Right ureteral stone    TMJ (temporomandibular joint syndrome)     Home Medications Prior to Admission medications   Medication Sig Start Date End Date Taking? Authorizing Provider  ondansetron  (ZOFRAN -ODT) 4 MG disintegrating tablet Take 1 tablet (4 mg total) by mouth every 8 (eight) hours as needed. 05/28/23  Yes Patsey Lot, MD  oxyCODONE -acetaminophen  (PERCOCET/ROXICET) 5-325 MG tablet Take 1-2 tablets by mouth every 8 (eight) hours as needed for severe pain (pain score 7-10). 05/28/23  Yes Patsey Lot, MD  tamsulosin  (FLOMAX ) 0.4 MG CAPS capsule Take 1 capsule (0.4 mg total) by mouth daily. 05/28/23  Yes Patsey Lot, MD  atorvastatin (LIPITOR) 20 MG tablet Take 20 mg by mouth daily.    [provider]  azelastine  (ASTELIN ) 0.1 % nasal spray Place 2 sprays into both nostrils 2 (two) times daily. Use in each nostril as directed Patient not taking: Reported on 05/30/2021 03/17/21   Teddy Sharper, FNP  EPINEPHrine  0.3 mg/0.3 mL IJ SOAJ injection Inject 0.3 mLs (0.3 mg total) into the muscle once. Patient not taking: Reported on 05/16/2021 07/24/14   Odell Balls, PA-C  fexofenadine (ALLEGRA) 180 MG tablet Take 180 mg by mouth daily. Patient not taking: Reported on  05/30/2021    [provider]  hydrochlorothiazide (HYDRODIURIL) 25 MG tablet Take 25 mg by mouth every morning.     [provider]  MAXALT 10 MG tablet Take 10 mg by mouth every 2 (two) hours as needed for migraine.     [provider]  metoprolol succinate (TOPROL-XL) 50 MG 24 hr tablet Take 25 mg by mouth daily.  11/09/13   [provider]  sertraline (ZOLOFT) 100 MG tablet Take 100 mg by mouth daily.     [provider]  valACYclovir (VALTREX) 1000 MG tablet Take 500 mg by mouth 2 (two) times daily.     [provider]      Allergies    Sulfa antibiotics, Almond mea, Ibuprofen, Neosporin [neomycin-bacitracin zn-polymyx], Other, and Penicillins    Review of Systems   Review of Systems  Genitourinary:  Positive for flank pain.    Physical Exam Updated Vital Signs BP (!) 142/77   Pulse 63   Temp 98.5 F (36.9 C) (Oral)   Resp 17   Wt 99.8 kg   LMP 12/25/2018   SpO2 97%   BMI 40.24 kg/m  Physical Exam Vitals and nursing note reviewed.  Constitutional:      Comments: Patient appears uncomfortable  Cardiovascular:     Rate and Rhythm: Regular rhythm.  Abdominal:     Tenderness: There is no abdominal tenderness.  Skin:    General: Skin is warm.     Capillary Refill: Capillary refill takes more than 3 seconds.  Neurological:     Mental Status: She is alert.     ED Results / Procedures / Treatments   Labs (all labs ordered are listed, but only abnormal results are displayed) Labs Reviewed  BASIC METABOLIC PANEL - Abnormal; Notable for the following components:      Result Value   Potassium 3.2 (*)    Glucose, Bld 146 (*)    BUN 21 (*)    All other components within normal limits  URINALYSIS, W/ REFLEX TO CULTURE (INFECTION SUSPECTED) - Abnormal; Notable for the following components:   Hgb urine dipstick LARGE (*)    Protein, ur TRACE (*)    All other components within normal limits  CBC WITH DIFFERENTIAL/PLATELET     EKG None  Radiology DG Abdomen 1 View Result Date: 05/28/2023 CLINICAL DATA:  Left flank pain.  History of kidney stones. EXAM: ABDOMEN - 1 VIEW COMPARISON:  CT from 01/30/2023 . FINDINGS: Lower pole right kidney calcification is again noted measuring approximately 5 mm. There is a stone scratch set on the left there is a calcification in the expected location of the proximal left ureter adjacent to the L3 left transverse process measuring 5 mm. Punctate calcification within upper pole of the left kidney is also identified measuring 3 mm. Bowel gas pattern is nonobstructed. No dilated large or small bowel loops. IMPRESSION: 1. 5 mm calcification in the expected location of the proximal left ureter adjacent to the L3 left transverse process. 2. Bilateral nephrolithiasis. Electronically Signed   By: Waddell Calk M.D.   On: 05/28/2023 11:58    Procedures Procedures    Medications Ordered in ED Medications  ondansetron  (ZOFRAN ) injection 4 mg (4 mg Intravenous Given 05/28/23 0943)  HYDROmorphone  (DILAUDID ) injection 0.5 mg (0.5 mg Intravenous Given 05/28/23 0942)  ketorolac  (TORADOL ) 15 MG/ML injection 15 mg (15 mg Intravenous Given 05/28/23 9056)    ED Course/ Medical Decision Making/ A&P                                 Medical Decision Making Amount and/or Complexity of Data Reviewed Labs: ordered. Radiology: ordered.  Risk Prescription drug management.   Patient with left-sided flank pain.  History of kidney stones.  I think this is most likely diagnosis.  Will get urinalysis to look for causes such as infection.  Reviewed previous CT scan and did show right sided ureteral stone but did have bilateral renal stones.  Will give pain medicine.  Will get KUB.  KUB shows possible ureteral stone.  Patient is better controlled with her pain.  Will discharge home.  Follow-up with urology.        Final Clinical Impression(s) / ED Diagnoses Final diagnoses:  Kidney stone    Rx /  DC Orders ED Discharge Orders          Ordered    oxyCODONE -acetaminophen  (PERCOCET/ROXICET) 5-325 MG tablet  Every 8 hours PRN        05/28/23 1355    ondansetron  (ZOFRAN -ODT) 4 MG disintegrating tablet  Every 8 hours PRN        05/28/23 1355    tamsulosin  (FLOMAX ) 0.4 MG CAPS capsule  Daily        05/28/23 1355              Cressie Betzler,  [place], MD 05/28/23 1357

## 2023-08-13 ENCOUNTER — Other Ambulatory Visit: Payer: Self-pay | Admitting: Urology

## 2023-08-13 ENCOUNTER — Encounter (HOSPITAL_COMMUNITY): Payer: Self-pay | Admitting: Urology

## 2023-08-13 NOTE — Progress Notes (Signed)
 Spoke w/ via phone for pre-op interview--- Lab needs dos-KUB         Lab results------ COVID test -patient states asymptomatic no test needed Arrive at --0800 NPO after MN NO Solid Food.  Clear liquids from MN until  0400 Pre-Surgery Ensure or G2:  Med rec completed Medications to take morning of surgery -zoloft, lipitor, oxy, hydrodiuril, maxalt, toprol, zofran Diabetic medication -----  GLP1 agonist last dose: GLP1 instructions:  Patient instructed no nail polish to be worn day of surgery Patient instructed to bring photo id and insurance card day of surgery Patient aware to have Driver (ride ) / caregiver    for 24 hours after surgery - spouse- Tanya Lawson and friend Darl Pikes Height Patient Special Instructions ----- Pre-Op special Instructions -per Litho  Patient verbalized understanding of instructions that were given at this phone interview. Patient denies chest pain, sob, fever, cough at the interview.

## 2023-08-14 NOTE — H&P (Signed)
 Office Visit Report     08/11/2023   --------------------------------------------------------------------------------   Tanya Lawson  MRN: [id number]  DOB: Oct 27, 1965, 58 year old Female  SSN: [id number]   PRIMARY CARE:  Mark A. Perini, MD  PRIMARY CARE FAX:  [phone]  REFERRING:  Loraine Leriche A. Waynard Edwards, MD  PROVIDER:  Vilma Prader, M.D.  LOCATION:  [facility] [phone]     --------------------------------------------------------------------------------   CC/HPI: 58 year old female who is seen for Dr. Benancio Deeds most recently seen in the ER on May 28, 2023. A CT scan from September 2024 demonstrates a distal right sided ureteral stone 4 mm in diameter. CT is also notable for bilateral nonobstructing stones. Unable to visualize ureteral stones on KUB from 05/28/2023. Creatinine was at baseline of 0.68 at ER visit.   PMH: Anxiety, heart HLD, HTN  PSH: C-section, tonsillectomy, left salpingectomy for ectopic pregnancy laparoscopic, URS x 2 most recent 2015   Nephrolithiasis:  07/09/2023: CT 9/25 R distal ureteral stone, and small non obstructive stone bilaterally, previously having pain, occasional pain on the L side. no GH, no dysuria. patient did take flomax for 7 days. CT shows left distal ureteral stone no right ureteral stones. Multiple nonobstructing stones in bilateral kidneys.  08/11/23: returns today for eval of TOP with KUB if still having pain get CT, stopped tamsulosin due to leg swelling, no pain since last visit, no GH. [place] not an option today due to no hydro an asymptomatic patient wants to confirmt hat stone has passed. Larina Bras has been present sinec january 1st.     ALLERGIES: Neomycin Sulfate TABS Penicillins Sulfa Drugs Tamsulosin    MEDICATIONS: Flomax 0.4 mg capsule 1 capsule PO Q HS  Metoprolol Succinate 50 mg tablet, extended release 24 hr 1 tablet PO Daily  Allegra Allergy 180 mg tablet 1 tablet PO Daily  Atorvastatin Calcium 20 MG Oral  Tablet Oral  Hydrochlorothiazide 25 mg tablet 0 Oral  Maxalt 10 mg tablet 1 tablet PO Daily  Toprol Xl 25 mg tablet, extended release 24 hr Oral  Valacyclovir 500 mg tablet Oral  Zoloft 100 MG Oral Tablet Oral     GU PSH: Cystoscopy Insert Stent - 2015, 2009 Ureteroscopic stone removal - 2015, 2009       PSH Notes: Cystoscopy With Ureteroscopy With Removal Of Calculus, Cystoscopy With Insertion Of Ureteral Stent Right, Cystoscopy With Insertion Of Ureteral Stent Right, Cystoscopy With Ureteroscopy With Removal Of Calculus, Tonsillectomy, Cesarean Section, Bil Salpin-oophorec Removal Of Solitary Ovary And Tube, Cervix Cryosurgery   NON-GU PSH: Cesarean Delivery Only - 2008 Cryocautery Cervix - 2008 Remove Tonsils - 2008 Visit Complexity (formerly GPC1X) - 07/09/2023     GU PMH: Renal and ureteral calculus - 07/09/2023, - 2021 Ureteral calculus, Calculus of distal right ureter - 2015, Calculus of ureter, - 2015, Ureteral Stone, - 2014 Renal calculus, Nephrolithiasis - 2015, Kidney stone on left side, - 2014, Kidney stone on right side, - 2014 Hydronephrosis Unspec, Hydronephrosis - 2014      PMH Notes:  2006-05-16 14:00:30 - Note: Anxiety   NON-GU PMH: Encounter for general adult medical examination without abnormal findings, Encounter for preventive health examination - 2015 Cardiac murmur, unspecified, Murmurs - 2014 Personal history of other diseases of the circulatory system, History of hypertension - 2014 Personal history of other endocrine, nutritional and metabolic disease, History of hypercholesterolemia - 2014    FAMILY HISTORY: Acute Myocardial Infarction - Father Arteriosclerosis - Runs in Family Breast Cancer - Mother Family Health  Status Number - Runs In Family Hypertension - Mother Ischemic Stroke - Mother, Father Kidney Stones - Uncle    Notes: Mother still living at age 39  Father deceased at age 59 from arteriosclerosis and dementia  Has 1 daughter and 1 son     SOCIAL HISTORY: Marital Status: Married Current Smoking Status: Patient does not smoke anymore. Has not smoked since 03/28/2015. Smoked for 30 years.   Tobacco Use Assessment Completed: Used Tobacco in last 30 days? Does drink.  Drinks 2 caffeinated drinks per day. Patient's occupation Sports administrator.     Notes: Occupation:, Tobacco Use, Marital History - Currently Married, Alcohol Use, Caffeine Use, Death In The Family Father   REVIEW OF SYSTEMS:    GU Review Female:   Patient denies frequent urination, hard to postpone urination, burning /pain with urination, get up at night to urinate, leakage of urine, stream starts and stops, trouble starting your stream, have to strain to urinate, and being pregnant.  Gastrointestinal (Upper):   Patient denies nausea, vomiting, and indigestion/ heartburn.  Gastrointestinal (Lower):   Patient denies diarrhea and constipation.  Constitutional:   Patient denies fever, night sweats, weight loss, and fatigue.  Skin:   Patient denies skin rash/ lesion and itching.  Eyes:   Patient denies double vision and blurred vision.  Ears/ Nose/ Throat:   Patient denies sore throat and sinus problems.  Hematologic/Lymphatic:   Patient denies swollen glands and easy bruising.  Cardiovascular:   Patient denies leg swelling and chest pains.  Respiratory:   Patient denies cough and shortness of breath.  Endocrine:   Patient denies excessive thirst.  Musculoskeletal:   Patient denies back pain and joint pain.  Neurological:   Patient denies headaches and dizziness.  Psychologic:   Patient denies depression and anxiety.   VITAL SIGNS: None   Complexity of Data:  Source Of History:  Patient  Records Review:   Previous Patient Records  Urine Test Review:   Urinalysis  X-Ray Review: C.T. Abdomen/Pelvis: Reviewed Films. Reviewed Report. Left distal ureteral stone still present. No hydronephrosis noted. Left stone visualized on scout film making her candidate for  ESWL.    PROCEDURES:         C.T. Urogram - [id number]      Patient confirmed No Neulasta OnPro Device.         Visit Complexity - G2211          Urinalysis Dipstick Dipstick Cont'd  Color: Straw Bilirubin: Neg mg/dL  Appearance: Clear Ketones: Neg mg/dL  Specific Gravity: <=4.098 Blood: Neg ery/uL  pH: 7.0 Protein: Neg mg/dL  Glucose: Neg mg/dL Urobilinogen: 0.2 mg/dL    Nitrites: Neg    Leukocyte Esterase: Neg leu/uL    ASSESSMENT:      ICD-10 Details  1 GU:   Renal and ureteral calculus - N20.2 Acute, Uncomplicated     PLAN:           Orders Labs Urine Culture  X-Rays: C.T. Stone Protocol Without I.V. Contrast  X-Ray Notes: . History:  Hematuria: Yes/No  Patient to see MD after exam: Yes/No  Previous exam: CT / IVP/ US/ KUB/ None  When:  Where:  Diabetic: Yes/ No  BUN/ Creatinine:  Date of last BUN Creatinine:  Weight in pounds:  Allergy- IV Contrast: Yes/ No  Conflicting diabetic meds: Yes/ No  Diabetic Meds:  Prior Authorization #: [employer]HC [id number]           Schedule  Document Letter(s):  Created for Patient: Clinical Summary         Notes:   Left distal ureteral stone: Patient has been present for over 2 months at this time feel that intervention is appropriate we discussed options of continuing trial of passage, ESWL and ureteroscopy. After discussion patient stated she would prefer ESWL I explained the patient that there is slightly increased risk of not clearing all the stone patient understands the risk and would like to proceed.   We discussed risk benefits alternatives procedure including bleeding infection pain as well as injury to the ureter as well as decrease stone free rate. Patient voiced her understanding consent was obtained. Urine culture ordered today.   CC: Mark A. Waynard Edwards, MD    * Signed by Vilma Prader, M.D. on 08/11/23 at 3:18 PM (EDT)*      The information contained in this medical record document is  considered private and confidential patient information. This information can only be used for the medical diagnosis and/or medical services that are being provided by the patient's selected caregivers. This information can only be distributed outside of the patient's care if the patient agrees and signs waivers of authorization for this information to be sent to an outside source or route.

## 2023-08-15 ENCOUNTER — Encounter (HOSPITAL_COMMUNITY)
Admission: RE | Disposition: A | Discharge status: Home / Self Care | Payer: Self-pay | Source: Home / Self Care | Attending: Urology

## 2023-08-15 ENCOUNTER — Ambulatory Visit (HOSPITAL_COMMUNITY)

## 2023-08-15 ENCOUNTER — Encounter (HOSPITAL_COMMUNITY): Payer: Self-pay | Admitting: Urology

## 2023-08-15 ENCOUNTER — Other Ambulatory Visit: Payer: Self-pay

## 2023-08-15 ENCOUNTER — Ambulatory Visit (HOSPITAL_COMMUNITY)
Admission: RE | Admit: 2023-08-15 | Discharge: 2023-08-15 | Disposition: A | Discharge status: Home / Self Care | Attending: Urology | Admitting: Urology

## 2023-08-15 DIAGNOSIS — N201 Calculus of ureter: Secondary | ICD-10-CM | POA: Diagnosis present

## 2023-08-15 DIAGNOSIS — N202 Calculus of kidney with calculus of ureter: Secondary | ICD-10-CM | POA: Insufficient documentation

## 2023-08-15 HISTORY — PX: EXTRACORPOREAL SHOCK WAVE LITHOTRIPSY: SHX1557

## 2023-08-15 SURGERY — LITHOTRIPSY, ESWL
Anesthesia: LOCAL | Laterality: Left

## 2023-08-15 MED ORDER — CIPROFLOXACIN HCL 500 MG PO TABS
500.0000 mg | ORAL_TABLET | ORAL | Status: AC
  Administered 2023-08-15: 500 mg via ORAL
  Filled 2023-08-15: qty 1

## 2023-08-15 MED ORDER — DIAZEPAM 5 MG PO TABS
10.0000 mg | ORAL_TABLET | ORAL | Status: AC
Start: 2023-08-15 — End: 2023-08-15
  Administered 2023-08-15: 10 mg via ORAL
  Filled 2023-08-15: qty 2

## 2023-08-15 MED ORDER — POTASSIUM CHLORIDE IN NACL 40-0.9 MEQ/L-% IV SOLN
INTRAVENOUS | Status: DC
Start: 2023-08-15 — End: 2023-08-15
  Filled 2023-08-15: qty 1000

## 2023-08-15 MED ORDER — SODIUM CHLORIDE 0.9 % IV SOLN
INTRAVENOUS | Status: DC
  Administered 2023-08-15: 1000 mL via INTRAVENOUS

## 2023-08-15 MED ORDER — DIPHENHYDRAMINE HCL 25 MG PO CAPS
25.0000 mg | ORAL_CAPSULE | ORAL | Status: AC
Start: 2023-08-15 — End: 2023-08-15
  Administered 2023-08-15: 25 mg via ORAL
  Filled 2023-08-15: qty 1

## 2023-08-15 NOTE — Interval H&P Note (Signed)
 History and Physical Interval Note:  08/15/2023 11:21 AM  Tanya Lawson  has presented today for surgery, with the diagnosis of LEFT URETERAL STONE.  The various methods of treatment have been discussed with the patient and family. After consideration of risks, benefits and other options for treatment, the patient has consented to  Procedure(s): LITHOTRIPSY, ESWL (Left) as a surgical intervention.  The patient's history has been reviewed, patient examined, no change in status, stable for surgery.  I have reviewed the patient's chart and labs. Preg test neg. No dysuria or gross hematuria.  No stone passage and remains visible on KUB. Questions were answered to the patient's satisfaction.     Jerilee Field

## 2023-08-15 NOTE — Progress Notes (Signed)
 No bruising or blistering on skin from procedure. Skin intact.

## 2023-08-15 NOTE — Discharge Instructions (Addendum)
 6 mm left distal stone  Left ESWL   Findings: good localization. Stone faded. She may need a staged procedure should she fail to pass a stone or stone fragment.

## 2023-08-18 ENCOUNTER — Encounter (HOSPITAL_COMMUNITY): Payer: Self-pay | Admitting: Urology

## 2024-02-16 ENCOUNTER — Ambulatory Visit (INDEPENDENT_AMBULATORY_CARE_PROVIDER_SITE_OTHER): Admitting: Infectious Diseases

## 2024-02-16 ENCOUNTER — Encounter: Payer: Self-pay | Admitting: Infectious Diseases

## 2024-02-16 ENCOUNTER — Other Ambulatory Visit: Payer: Self-pay

## 2024-02-16 VITALS — BP 120/73 | HR 58 | Temp 98.3°F | Ht 62.0 in | Wt 235.0 lb

## 2024-02-16 DIAGNOSIS — Z79899 Other long term (current) drug therapy: Secondary | ICD-10-CM

## 2024-02-16 DIAGNOSIS — Z114 Encounter for screening for human immunodeficiency virus [HIV]: Secondary | ICD-10-CM | POA: Diagnosis not present

## 2024-02-16 DIAGNOSIS — B009 Herpesviral infection, unspecified: Secondary | ICD-10-CM | POA: Diagnosis not present

## 2024-02-16 NOTE — Progress Notes (Signed)
 There are no active problems to display for this patient.   Patient's Medications  New Prescriptions   No medications on file  Previous Medications   ATORVASTATIN (LIPITOR) 20 MG TABLET    Take 20 mg by mouth daily.   AZELASTINE  (ASTELIN ) 0.1 % NASAL SPRAY    Place 2 sprays into both nostrils 2 (two) times daily. Use in each nostril as directed   EPINEPHRINE  0.3 MG/0.3 ML IJ SOAJ INJECTION    Inject 0.3 mLs (0.3 mg total) into the muscle once.   FEXOFENADINE (ALLEGRA) 180 MG TABLET    Take 180 mg by mouth daily.   FLUTICASONE (FLONASE) 50 MCG/ACT NASAL SPRAY    Place 2 sprays into both nostrils daily as needed for allergies or rhinitis.   HYDROCHLOROTHIAZIDE (HYDRODIURIL) 25 MG TABLET    Take 25 mg by mouth every morning.    MAXALT 10 MG TABLET    Take 10 mg by mouth every 2 (two) hours as needed for migraine.    METOPROLOL SUCCINATE (TOPROL-XL) 50 MG 24 HR TABLET    Take 25 mg by mouth daily.    ONDANSETRON  (ZOFRAN -ODT) 4 MG DISINTEGRATING TABLET    Take 1 tablet (4 mg total) by mouth every 8 (eight) hours as needed.   OXYCODONE -ACETAMINOPHEN  (PERCOCET/ROXICET) 5-325 MG TABLET    Take 1-2 tablets by mouth every 8 (eight) hours as needed for severe pain (pain score 7-10).   SERTRALINE (ZOLOFT) 100 MG TABLET    Take 100 mg by mouth daily.    TAMSULOSIN  (FLOMAX ) 0.4 MG CAPS CAPSULE    Take 1 capsule (0.4 mg total) by mouth daily.   VALACYCLOVIR (VALTREX) 1000 MG TABLET    Take 500 mg by mouth 2 (two) times daily.   Modified Medications   No medications on file  Discontinued Medications   No medications on file    Subjective: Discussed the use of AI scribe software for clinical note transcription with the patient, who gave verbal consent to proceed.   58 Y O female with prior h/o kidney stone, HTN, HLD, anxiety, allergy who is referred from PCP for concern for recurrent herpes infection.   Patient reports he has herpes outbreaks that have been recurrent for approximately 20 years,  initially presenting as blisters with surrounding skin swelling and fever. In 2015, herpes was visually diagnosed by PCP, never tested and Valacyclovir 500 mg daily was prescribed for suppression, effectively preventing outbreaks for extended periods. Recently, flare-ups have increased in frequency, occurring three to four times this year despite adherence to medication. Current episodes involve red, hot, hard skin on the buttocks and fever up to 104F, without blister formation. Missing a dose of Valacyclovir can trigger a flare-up, but recent episodes have occurred even with consistent dosing. During flare-ups, she increases Valacyclovir to 1000 mg for three days, which helps control symptoms.  No current flare-ups, cold sores. Denies nausea vomiting, diarrhea, or respiratory or genitourinary issues. She has been tested for HIV a long time ago and is willing to be retested.  She was last seen by PCP on 6/17 for concerns of red area over posterior buttocks but not breaking out, left medial buttock, some tingling. No vesicles or ulcers. Was prescribed PO doxycycline as well as Valacyclovir dosing increased to 1g po bid for a week approx with improvement in symptoms.   She reports she was not a saint prior to marriage and husband also had extra marital affair.  Denies smoking alcohol, recreational drugs  No concerns today and deferred GU exam.   Review of Systems: All systems reviewed with pertinent positive and negative as listed above  Past Medical History:  Diagnosis Date   Allergy    Anxiety    H/O cold sores    Heart murmur    History of kidney stones    Hyperlipidemia    Hypertension    Right ureteral stone    TMJ (temporomandibular joint syndrome)    Past Surgical History:  Procedure Laterality Date   CESAREAN SECTION  1999  &  09-09-2001   COLONOSCOPY  12/30/2018   Dr.Jacobs   CYSTO/  RIGHT RETROGRADE PYELOGRAM/  RIGHT URETEROSCOPIC LASER LITHOTRIPSY STONE EXTRACTION/  STENT  PLACEMENT  05-02-2006   &   09-21-2007   CYSTOSCOPY WITH RETROGRADE PYELOGRAM, URETEROSCOPY AND STENT PLACEMENT Right 11/23/2013   Procedure: CYSTOSCOPY WITH RETROGRADE PYELOGRAM, URETEROSCOPY, STONE MANIPULATION  AND STENT PLACEMENT;  Surgeon: Oliva VEAR Oiler, MD;  Location: [address] [facility] [place];  Service: Urology;  Laterality: Right;   EXTRACORPOREAL SHOCK WAVE LITHOTRIPSY Left 08/15/2023   Procedure: LITHOTRIPSY, ESWL;  Surgeon: Nieves Cough, MD;  Location: [facility];  Service: Urology;  Laterality: Left;   LAPAROSCOPY WITH LEFT SALPINGECTOMY W/ RESECTION OF ECTOPIC PREGNANCY  04/27/2000   NEGATIVE SLEEP STUDY   05/28/2011   POLYPECTOMY     TONSILLECTOMY  05/28/1983     Social History   Tobacco Use   Smoking status: Former    Current packs/day: 0.00    Average packs/day: 0.5 packs/day for 8.0 years (4.0 ttl pk-yrs)    Types: Cigarettes    Start date: 11/19/1980    Quit date: 11/19/1988    Years since quitting: 35.2   Smokeless tobacco: Never  Vaping Use   Vaping status: Never Used  Substance Use Topics   Alcohol use: Yes    Comment: RARE   Drug use: No    Family History  Problem Relation Age of Onset   Heart attack Father    Stroke Father    Dementia Father    Breast cancer Mother    Hypertension Mother    Hyperlipidemia Mother    Colon polyps Neg Hx    Colon cancer Neg Hx    Esophageal cancer Neg Hx    Stomach cancer Neg Hx    Rectal cancer Neg Hx     Allergies  Allergen Reactions   Sulfa Antibiotics Hives, Swelling and Other (See Comments)   Almond Meal (Obsolete) Other (See Comments)    Almonds and avacodos causes stomach cramps   Ibuprofen Swelling   Neosporin [Neomycin-Bacitracin Zn-Polymyx] Hives   Other Other (See Comments)    BANANA-- MOUTH ITCHES POSITIVE ALMOND ALLERGY TEST   Penicillins Hives and Swelling   Tamsulosin  Swelling    Possibility due to Knees swelling and became hard and itchy.    Health Maintenance  Topic Date Due   HIV  Screening  Never done   Hepatitis C Screening  Never done   DTaP/Tdap/Td (1 - Tdap) Never done   Hepatitis B Vaccines 19-59 Average Risk (1 of 3 - 19+ 3-dose series) Never done   Zoster Vaccines- Shingrix (1 of 2) Never done   Cervical Cancer Screening (HPV/Pap Cotest)  Never done   Pneumococcal Vaccine: 50+ Years (1 of 1 - PCV) Never done   Influenza Vaccine  Never done   COVID-19 Vaccine (4 - 2025-26 season) 01/26/2024   Mammogram  05/25/2025   Colonoscopy  05/30/2026   HPV VACCINES  Aged  Out   Meningococcal B Vaccine  Aged Out    Objective: BP 120/73   Pulse (!) 58   Temp 98.3 F (36.8 C) (Oral)   Ht 5' 2 (1.575 m)   Wt 235 lb (106.6 kg)   LMP 12/25/2018   SpO2 95%   BMI 42.98 kg/m    Physical Exam Constitutional:      Appearance: Normal appearance.  Morbidly obese HENT:     Head: Normocephalic and atraumatic.      Mouth: Mucous membranes are moist.  Eyes:    Conjunctiva/sclera: Conjunctivae normal.     Pupils: Pupils are equal, round, and b/l symmetrical    Cardiovascular:     Rate and Rhythm: Normal rate and regular rhythm.     Heart sounds: S1 and S2  Pulmonary:     Effort: Pulmonary effort is normal.     Breath sounds: Normal breath sounds.   Abdominal:     General: Non distended     Palpations: soft.   GU exam deferred by patient  Musculoskeletal:        General: Normal range of motion.   Skin:    General: Skin is warm and dry.     Comments:  Neurological:     General: grossly non focal     Mental Status: awake, alert and oriented to person, place, and time.   Psychiatric:        Mood and Affect: Mood normal.   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  She can continue suppressive valacyclovir , 500mg  po daily in the meantime.  She has also been referred to dermatology and can follow-up further opinion - Follow-up as needed/new concerns  # HIV screening - HIV ag/ab   I spent 60 minutes involved in face-to-face and non-face-to-face activities for this patient on the day of the visit. Professional time spent includes the following activities: Preparing to see the patient (review of tests), Obtaining and reviewing separately obtained history (outside referral note), Performing a medically appropriate examination and evaluation, referring and communicating with other health care professionals, Documenting clinical information in the EMR, Independently interpreting results (not separately reported), Communicating results to the patient, Counseling and educating the patient and Care coordination (not separately reported).   Of note, portions of this note may have been created with voice recognition software. While this note has been edited for accuracy, occasional wrong-word or 'sound-a-like' substitutions may have occurred due to the inherent limitations of voice recognition software.   Annalee Joseph, MD [facility] for Infectious [facility] 02/16/2024, 12:02 PM

## 2024-02-18 LAB — HIV ANTIBODY (ROUTINE TESTING W REFLEX)
HIV 1&2 Ab, 4th Generation: NONREACTIVE
HIV FINAL INTERPRETATION: NEGATIVE

## 2024-02-25 ENCOUNTER — Ambulatory Visit: Payer: Self-pay | Admitting: Infectious Diseases
